# Patient Record
Sex: Female | Born: 1975
Health system: Southern US, Community
[De-identification: ages and names within clinical notes are randomized; demographics above are authoritative.]

## PROBLEM LIST (undated history)

## (undated) DIAGNOSIS — F32A Depression, unspecified: Secondary | ICD-10-CM

## (undated) DIAGNOSIS — I10 Essential (primary) hypertension: Secondary | ICD-10-CM

## (undated) DIAGNOSIS — E119 Type 2 diabetes mellitus without complications: Secondary | ICD-10-CM

## (undated) HISTORY — PX: UTERINE FIBROID SURGERY: SHX826

## (undated) HISTORY — PX: BUNIONECTOMY: SHX129

---

## 1998-11-17 ENCOUNTER — Ambulatory Visit (HOSPITAL_COMMUNITY): Admission: RE | Admit: 1998-11-17 | Discharge: 1998-11-17 | Payer: Self-pay | Admitting: Family Medicine

## 1999-04-08 ENCOUNTER — Encounter (INDEPENDENT_AMBULATORY_CARE_PROVIDER_SITE_OTHER): Payer: Self-pay | Admitting: *Deleted

## 1999-04-08 ENCOUNTER — Ambulatory Visit (HOSPITAL_COMMUNITY): Admission: AD | Admit: 1999-04-08 | Discharge: 1999-04-08 | Payer: Self-pay | Admitting: Obstetrics and Gynecology

## 1999-09-24 ENCOUNTER — Encounter: Payer: Self-pay | Admitting: *Deleted

## 1999-09-24 ENCOUNTER — Inpatient Hospital Stay (HOSPITAL_COMMUNITY): Admission: AD | Admit: 1999-09-24 | Discharge: 1999-09-24 | Payer: Self-pay

## 2000-03-10 ENCOUNTER — Inpatient Hospital Stay (HOSPITAL_COMMUNITY): Admission: AD | Admit: 2000-03-10 | Discharge: 2000-03-10 | Payer: Self-pay | Admitting: Obstetrics and Gynecology

## 2000-03-25 ENCOUNTER — Inpatient Hospital Stay (HOSPITAL_COMMUNITY): Admission: AD | Admit: 2000-03-25 | Discharge: 2000-03-25 | Payer: Self-pay | Admitting: *Deleted

## 2000-04-04 ENCOUNTER — Inpatient Hospital Stay (HOSPITAL_COMMUNITY): Admission: AD | Admit: 2000-04-04 | Discharge: 2000-04-04 | Payer: Self-pay | Admitting: Obstetrics and Gynecology

## 2000-04-06 ENCOUNTER — Inpatient Hospital Stay (HOSPITAL_COMMUNITY): Admission: AD | Admit: 2000-04-06 | Discharge: 2000-04-08 | Payer: Self-pay | Admitting: Obstetrics and Gynecology

## 2000-04-09 ENCOUNTER — Encounter: Admission: RE | Admit: 2000-04-09 | Discharge: 2000-07-08 | Payer: Self-pay | Admitting: Obstetrics and Gynecology

## 2000-07-10 ENCOUNTER — Encounter: Admission: RE | Admit: 2000-07-10 | Discharge: 2000-10-08 | Payer: Self-pay | Admitting: Obstetrics and Gynecology

## 2000-10-10 ENCOUNTER — Encounter: Admission: RE | Admit: 2000-10-10 | Discharge: 2001-01-08 | Payer: Self-pay | Admitting: Obstetrics and Gynecology

## 2001-02-07 ENCOUNTER — Encounter: Admission: RE | Admit: 2001-02-07 | Discharge: 2001-03-09 | Payer: Self-pay | Admitting: Obstetrics and Gynecology

## 2001-03-10 ENCOUNTER — Encounter: Admission: RE | Admit: 2001-03-10 | Discharge: 2001-04-09 | Payer: Self-pay | Admitting: Obstetrics and Gynecology

## 2001-05-10 ENCOUNTER — Encounter: Admission: RE | Admit: 2001-05-10 | Discharge: 2001-06-09 | Payer: Self-pay | Admitting: Obstetrics and Gynecology

## 2001-06-10 ENCOUNTER — Encounter: Admission: RE | Admit: 2001-06-10 | Discharge: 2001-07-10 | Payer: Self-pay | Admitting: Obstetrics and Gynecology

## 2001-09-02 ENCOUNTER — Emergency Department (HOSPITAL_COMMUNITY): Admission: EM | Admit: 2001-09-02 | Discharge: 2001-09-02 | Payer: Self-pay | Admitting: Emergency Medicine

## 2001-09-02 ENCOUNTER — Encounter: Payer: Self-pay | Admitting: Emergency Medicine

## 2004-07-31 ENCOUNTER — Emergency Department (HOSPITAL_COMMUNITY): Admission: EM | Admit: 2004-07-31 | Discharge: 2004-07-31 | Payer: Self-pay | Admitting: Family Medicine

## 2005-12-30 ENCOUNTER — Emergency Department (HOSPITAL_COMMUNITY): Admission: EM | Admit: 2005-12-30 | Discharge: 2005-12-30 | Payer: Self-pay | Admitting: Emergency Medicine

## 2007-07-01 ENCOUNTER — Encounter (INDEPENDENT_AMBULATORY_CARE_PROVIDER_SITE_OTHER): Payer: Self-pay | Admitting: Obstetrics

## 2007-07-01 ENCOUNTER — Ambulatory Visit (HOSPITAL_COMMUNITY): Admission: RE | Admit: 2007-07-01 | Discharge: 2007-07-01 | Payer: Self-pay | Admitting: Obstetrics

## 2007-07-25 ENCOUNTER — Inpatient Hospital Stay (HOSPITAL_COMMUNITY): Admission: RE | Admit: 2007-07-25 | Discharge: 2007-07-25 | Payer: Self-pay | Admitting: Obstetrics and Gynecology

## 2007-07-25 ENCOUNTER — Encounter (INDEPENDENT_AMBULATORY_CARE_PROVIDER_SITE_OTHER): Payer: Self-pay | Admitting: Obstetrics and Gynecology

## 2008-08-11 ENCOUNTER — Encounter: Admission: RE | Admit: 2008-08-11 | Discharge: 2008-08-11 | Payer: Self-pay | Admitting: Family Medicine

## 2008-12-25 ENCOUNTER — Ambulatory Visit: Payer: Self-pay | Admitting: Obstetrics and Gynecology

## 2008-12-25 ENCOUNTER — Inpatient Hospital Stay (HOSPITAL_COMMUNITY): Admission: AD | Admit: 2008-12-25 | Discharge: 2008-12-25 | Payer: Self-pay | Admitting: Obstetrics

## 2009-06-07 ENCOUNTER — Inpatient Hospital Stay (HOSPITAL_COMMUNITY): Admission: RE | Admit: 2009-06-07 | Discharge: 2009-06-09 | Payer: Self-pay | Admitting: Obstetrics

## 2009-06-07 ENCOUNTER — Encounter (INDEPENDENT_AMBULATORY_CARE_PROVIDER_SITE_OTHER): Payer: Self-pay | Admitting: Obstetrics

## 2010-01-14 ENCOUNTER — Emergency Department (HOSPITAL_COMMUNITY): Admission: EM | Admit: 2010-01-14 | Discharge: 2010-01-14 | Payer: Self-pay | Admitting: Emergency Medicine

## 2010-10-31 ENCOUNTER — Emergency Department (HOSPITAL_COMMUNITY)
Admission: EM | Admit: 2010-10-31 | Discharge: 2010-10-31 | Payer: Self-pay | Source: Home / Self Care | Admitting: Family Medicine

## 2010-11-01 LAB — POCT URINALYSIS DIPSTICK
Bilirubin Urine: NEGATIVE
Nitrite: NEGATIVE
Specific Gravity, Urine: 1.02 (ref 1.005–1.030)
Urine Glucose, Fasting: NEGATIVE mg/dL
Urobilinogen, UA: 1 mg/dL (ref 0.0–1.0)

## 2010-11-01 LAB — POCT PREGNANCY, URINE: Preg Test, Ur: NEGATIVE

## 2010-12-27 LAB — COMPREHENSIVE METABOLIC PANEL
ALT: 17 U/L (ref 0–35)
AST: 22 U/L (ref 0–37)
Albumin: 4.1 g/dL (ref 3.5–5.2)
Chloride: 107 mEq/L (ref 96–112)
Creatinine, Ser: 0.65 mg/dL (ref 0.4–1.2)
GFR calc Af Amer: >60 mL/min (ref >60)
Potassium: 4.2 mEq/L (ref 3.5–5.1)
Sodium: 139 mEq/L (ref 135–145)
Total Bilirubin: 0.8 mg/dL (ref 0.3–1.2)

## 2010-12-27 LAB — GC/CHLAMYDIA PROBE AMP, GENITAL
Chlamydia, DNA Probe: NEGATIVE
GC Probe Amp, Genital: NEGATIVE

## 2010-12-27 LAB — DIFFERENTIAL
Basophils Absolute: 0.1 10*3/uL (ref 0.0–0.1)
Eosinophils Absolute: 0.2 10*3/uL (ref 0.0–0.7)
Eosinophils Relative: 4 % (ref 0–5)
Lymphocytes Relative: 35 % (ref 12–46)
Monocytes Absolute: 0.4 10*3/uL (ref 0.1–1.0)

## 2010-12-27 LAB — URINALYSIS, ROUTINE W REFLEX MICROSCOPIC
Ketones, ur: NEGATIVE mg/dL
Nitrite: NEGATIVE
Protein, ur: NEGATIVE mg/dL

## 2010-12-27 LAB — CBC
Platelets: 222 10*3/uL (ref 150–400)
RBC: 4.82 MIL/uL (ref 3.87–5.11)
WBC: 5.9 10*3/uL (ref 4.0–10.5)

## 2010-12-27 LAB — WET PREP, GENITAL

## 2010-12-27 LAB — URINE MICROSCOPIC-ADD ON

## 2011-01-12 LAB — CBC
HCT: 28.9 % — ABNORMAL LOW (ref 36.0–46.0)
Hemoglobin: 9.4 g/dL — ABNORMAL LOW (ref 12.0–15.0)
MCHC: 32.4 g/dL (ref 30.0–36.0)
RDW: 14.6 % (ref 11.5–15.5)

## 2011-01-13 LAB — BASIC METABOLIC PANEL
CO2: 28 mEq/L (ref 19–32)
Calcium: 9 mg/dL (ref 8.4–10.5)
Creatinine, Ser: 0.56 mg/dL (ref 0.4–1.2)
GFR calc Af Amer: >60 mL/min (ref >60)
Glucose, Bld: 97 mg/dL (ref 70–99)

## 2011-01-13 LAB — TYPE AND SCREEN
ABO/RH(D): O POS
Antibody Screen: NEGATIVE

## 2011-01-13 LAB — CBC
MCHC: 31.9 g/dL (ref 30.0–36.0)
RDW: 14 % (ref 11.5–15.5)

## 2011-01-18 LAB — CBC
HCT: 36 % (ref 36.0–46.0)
MCV: 79.9 fL (ref 78.0–100.0)
Platelets: 219 10*3/uL (ref 150–400)
RDW: 13.5 % (ref 11.5–15.5)

## 2011-02-20 NOTE — Op Note (Signed)
[name], [name] NO.:  [id number]   MEDICAL RECORD NO.:  [id number]          PATIENT TYPE:  AMB   LOCATION:  SDC                           FACILITY:  [facility]   PHYSICIAN:  Lendon Colonel, MD   DATE OF BIRTH:  [birth year]   DATE OF PROCEDURE:  07/01/2007  DATE OF DISCHARGE:                               OPERATIVE REPORT   PREOPERATIVE DIAGNOSIS:  Undesired pregnancy at 11 plus weeks.   POSTOPERATIVE DIAGNOSES:  1. Undesired pregnancy at 11 plus weeks.  2. Large lower segment fibroid.   PROCEDURE:  Attempted dilatation and evacuation under ultrasound  guidance.   SURGEON:  Lendon Colonel, MD.   ASSISTANT:  None.   ANESTHESIA:  Local with sedation.   FINDINGS:  A 10 x 9 x 9 cm lower posterior segment intramural fibroid.  Active fetal heartbeat post-procedure.   SPECIMEN:  EMC sent to Pathology.   ESTIMATED BLOOD LOSS:  50 mL.   ANTIBIOTICS:  100 mg IV doxycycline.   COMPLICATIONS:  Failed procedure.   PROCEDURE IN DETAIL:  After meeting with the patient in the pre-op area  and reviewing risks of procedure (including increased risks in her case  given the large fibroid), informed consent was obtained. The patient was  taken to the operating room where sedation was initiated.  A bimanual  examination was performed which revealed a uterus twice the size  expected.  A sterile speculum was inserted into the vagina, 1% lidocaine  6 mL each at 4 and 7 o'clock in the cervical paracervical junction was  administered for local anesthesia.  A tenaculum was placed at 12 o'clock  on the cervix.  The cervix was serially dilated to a number 35 Pratt  dilator without complication.  A 12-French suction curet was then  inserted into the endometrial canal and on three passes removed a scant  amount of tissue.  A very gentle sharp curettage was performed and a  gritty texture, consistent with complete evacuation, was noted.  Due to  the scant amount of tissue  obtained, and the the smaller than expected  uterine sound length, ultrasound was called for.  Upon ultrasonographic  evaluation, a very large posterior lower segment fibroid was noted and  the embryo was seen with an active FH, anterior and superior to the  fibroid.  Multiple attempts were made to access the gestational sac.  Small pratt dilators and the malleable sound were used.  However, given  the position of the fibroid, the gestational sac was unable to be  successfully accessed.  Approximately 15 minutes were spent under  ultrasound guidance attempting to access the gestational sac.  The  tenaculum was changed to the posterior lip of the cervix and the  speculum was removed to allow for further anteversion of the  instruments,  however, access to the unwanted pregnancy was not  accomplished.  It was then decided to terminate the procedure.  All  instruments were removed.  The tenaculum was removed.  Excellent  hemostasis was noted from the tenaculum site.  The patient was awakened  from her sedation,  having tolerated the procedure well.  The sponge,  lap, and needle counts were correct times three.  The patient was  recovering in the PACU with stable vital signs.  A long discussion was  had with the patient and her family regarding the results of the  procedure and the decision to stop the procedure. Patient was offerred  an evaluation at [facility] with possibility of repeat attempt at  [facility].  The patient was given Motrin p.r.n., Flagyl 500 mg p.o.  twice daily for antibiotic prophylaxis given the cervical canal was  opened and was given the personal phone number of this physician for  contact at any time should she have any questions. Pt was asked to call  imediately should she notice increased bleeding, sharp abdominal pain or  fever of any sort.      Lendon Colonel, MD  Electronically Signed     KAF/MEDQ  D:  07/01/2007  T:  07/01/2007  Job:  [id number]

## 2011-02-20 NOTE — Op Note (Signed)
NAMEAQUEELAH, COTRELL NO.:  [id number]   MEDICAL RECORD NO.:  [id number]          PATIENT TYPE:  INP   LOCATION:  9305                          FACILITY:  [facility]   PHYSICIAN:  Lendon Colonel, MD   DATE OF BIRTH:  [birth year]   DATE OF PROCEDURE:  06/07/2009  DATE OF DISCHARGE:                               OPERATIVE REPORT   PREOPERATIVE DIAGNOSES:  Menorrhagia, fibroid uterus.   POSTOPERATIVE DIAGNOSES:  Menorrhagia, fibroid uterus.   PROCEDURES:  Exploratory laparotomy, myomectomy.   SURGEON:  Lendon Colonel, MD.   ASSISTANT:  Dr. Juliene Pina.   ANESTHESIA:  General.   FINDINGS:  Normal tubes and ovaries, 5 x 5 cm posterior uterine fibroid.   SPECIMENS:  Fibroid.   DISPOSITIONS:  Pathology.   ANTIBIOTICS:  Ancef 2 g.   ESTIMATED BLOOD LOSS:  400 mL.   COMPLICATIONS:  None.   INDICATIONS:  This is a 35 year old G6, P2 with history of menorrhagia,  large fibroid uterus, fatigue, anemia, and mass symptoms, who presents  for operative management of fibroids after 3 months of Depo-Lupron for  improvement in anemia and shrinkage of fibroids.  The patient understood  her options of robotic myomectomy, uterine artery embolization, and  hormonal control.   PROCEDURE IN DETAILS:  After informed consent was obtained, the patient  was taken to the operating room where general anesthesia was initiated  without difficulty.  She was prepped and draped in the normal sterile  fashion in the dorsal supine position.  A Foley catheter was inserted  sterilely into the bladder.  A bimanual examination was done to assess  the size and the position of the uterus.  A Pfannenstiel skin incision  was made 2 cm above the pubic symphysis in the midline with the scalpel  and was carried through to the underlying layer of fascia with Bovie  cautery.  Capillary perforators were cauterized upon entry.  The fascia  was incised in the midline and the incision was extended laterally.   The  inferior aspect of the fascial incision was grasped with Kocher clamps,  elevated up and the underlying rectus muscles were dissected off bluntly  and with the Bovie cautery. The superior aspect of the fascial incision  was grasped with Kocher clamps, elevated up and the underlying rectus  muscles were dissected off bluntly and with the Bovie cautery, the  rectus muscles were separated in the midline.  The peritoneum was  identified, bowel could be seen peristalsing below the peritoneum,  peritoneum was picked up with pickups x2 and entered sharply.  Peritoneal incision was extended superiorly and inferiorly with good  visualization of the bladder.  A Balfour self-retaining retractor was  placed into the incision and opened, the bladder blade was inserted.  Green towels were used to raise the Balfour retractor off the patient's  skin and soft tissue.  Two moist laparotomy sponges were used to pack  away the bowel and the patient was placed in Trendelenburg.  The tubes  and ovaries were evaluated, found to be normal.  The uterus was  evaluated, 5 cm posterior  fibroid was noted.  A solution of 20 units of  vasopressin in 30 mL of normal saline was used, about 10 mL was used to  inject. A 4-cm incision on the posterior wall of the uterus was made  using the needle-point Bovie, incision was made through the serosa and  the myometrium until the fibroid was encountered.  Given the prior Depo-  Lupron, the plane between the fibroid and the myometrium was not clear  initially and bleeding was noted.  This was controlled with additional  Bovie cautery.  Using Allis clamps on the uterus, thyroid clamps on the  fibroid, the fibroid was serially dissected out with Bovie cautery.  The  base of the fibroid was cauterized out, removed, and fibroid was  measured and sent to Pathology.  Several figure-of-eight sutures were  used in the base of the defect to control hemostasis.  Of note, the   endometrial cavity was not encountered.  About 6 figure-of-eight sutures  were used to control hemostasis.  A deep figure-of-eight suture was then  placed to further close the defect.  A vertical closure was then done.  The myometrium was closed in 2 additional layers and the serosa was  closed with a 2-0 Vicryl in a baseball stitch fashion.  Excellent  hemostasis was noted.  A piece of Interceed was placed over the uterine  incision after confirmation of hemostasis and irrigation.  The packing  was removed.  The peritoneum was closed with 2-0 Vicryl.  The cut muscle  edges on either side of the fascia were inspected, found to be  hemostatic.  Fascia was closed with 0 Vicryl in a running fashion in two  halves.  The subcutaneous tissue was closed with a 2-0 plain gut and the  skin was closed with a 4-0 Monocryl in a subcuticular fashion.  The  patient tolerated the procedure well.  Sponge, lap, and needle counts  were correct x3, and the patient was taken to the recovery room in  stable condition.       Lendon Colonel, MD  Electronically Signed     KAF/MEDQ  D:  06/07/2009  T:  06/07/2009  Job:  [id number]

## 2011-02-23 NOTE — H&P (Signed)
[facility]  Patient:    [name], ELSAYED                     MRN: [id number] Adm. Date:  [id number] Attending:  Leonard Schwartz Dictator:   Miguel Dibble, C.N.M.                         History and Physical  DATE OF BIRTH:                05-Feb-1976.  HISTORY OF PRESENT ILLNESS:   This is a 35 year old gravida 4, para 0, 1, 2, 1 at 36 weeks with contractions since approximately 1 a.m. that have been strong and regular.  Her cervix was 1 cm at her last vaginal exam in the office.  Her cervix has changed from 3, 80%, -1 to 4, 90% and -1 after two hours of ambulation in maternity admissions unit.  She has a negative group beta strep history.  She has had a previous labor of four hours at 36 weeks after an attempt to manage preterm labor for approximately 24 hours.  PRENATAL LABORATORY DATA:     Her prenatal laboratories are as follows. Hemoglobin 12.2, hematocrit 37.6 and platelets 231,000.  Blood type and Rh:  O positive, Rh antibodies negative.  VDRL nonreactive.  Rubella titer immune. Hepatitis B surface antigen negative.  Glucose challenge test at 18 weeks was 93.  Pap smear; benign reactive changes.  Gonorrhea and Chlamydia cultures negative.  Glucose challenge test at 28 weeks was within normal limits.  Group beta strep at 35 weeks is negative.   Patient was treated for UTI during the pregnancy.  Normal ultrasound revealing normal anatomy, confirms dates.  MEDICAL HISTORY:              No known drug allergies.  Environmental allergies.  Abnormal Pap smear in 1999; repeat Pap smears since then have been normal.  Treated for Chlamydia in the past and Trichomonas in the past. Wisdom tooth extraction in April of 1999.  D&E in 05-11-99.  FAMILY HISTORY:               Paternal grandmother with hypertension. Maternal grandmother with varicosities.  Paternal grandmother with insulin-dependent diabetes mellitus.  Paternal aunt with  breast cancer postmenopausal.  Mother with bipolar disorder.  GENETIC HISTORY:              Negative.  SOCIAL HISTORY:               Father of the baby is involved.  Patients mother is also present and supportive.  Patient is a Engineer, maintenance (IT).  Works for [employer] full-time.  Father of the baby is a high Garment/textile technologist and works full-time.  Denies smoking, alcohol or drug abuse.  OBSTETRICAL HISTORY:          October of 1993, termination of pregnancy at eight weeks without complications.  August of 1994, spontaneous vaginal delivery of a viable female at 36 weeks weighing 5 pounds 15 ounces after four hours of active labor.  This pregnancy was complicated by non- insulin-dependent gestational diabetes.   May 11, 1999, intrauterine fetal death at 13 weeks followed by a D&E.  PHYSICAL EXAMINATION:  HEENT:                        Head, eyes, ears, nose, and throat are within  normal limits.  LUNGS:                        Bilaterally clear.  HEART:                        Regular rate and rhythm.  ABDOMEN:                      Soft and nontender.  Contractions every three to four minutes.  Fetal heart rate is reactive and reassuring.  PELVIC EXAMINATION:           Cervix is 4 cm, 90% effaced, vertex and -1. Intact bag of waters.  EXTREMITIES:                  Negative edema.  NEUROLOGIC:                   DTRs +1.  ASSESSMENT:                   1. Multiparous at 36 weeks.                               2. Previous history of preterm labor and                                  delivery at 36 weeks.                               3. Negative group Beta Streptococcus.  PLAN:                         1. Admit to labor and delivery.                               2. Notify Dr. Stefano Gaul of admission and status.                               3. Plans for C.N.M. management per patients                                  request.                               4. Routine [facility]  orders.                               5. Anticipate normal spontaneous vaginal                                  delivery. DD:  04/06/00 TD:  04/06/00 Job: [id number]

## 2011-04-09 ENCOUNTER — Inpatient Hospital Stay (INDEPENDENT_AMBULATORY_CARE_PROVIDER_SITE_OTHER)
Admission: RE | Admit: 2011-04-09 | Discharge: 2011-04-09 | Disposition: A | Discharge status: Home / Self Care | Payer: Self-pay | Source: Ambulatory Visit | Attending: Family Medicine | Admitting: Family Medicine

## 2011-04-09 DIAGNOSIS — J069 Acute upper respiratory infection, unspecified: Secondary | ICD-10-CM

## 2011-07-18 LAB — CBC
Platelets: 313
RDW: 14.4 — ABNORMAL HIGH

## 2011-07-19 LAB — CBC
Hemoglobin: 11.6 — ABNORMAL LOW
RBC: 4.6
RDW: 15.1 — ABNORMAL HIGH

## 2011-08-18 ENCOUNTER — Emergency Department (HOSPITAL_COMMUNITY)
Admission: EM | Admit: 2011-08-18 | Discharge: 2011-08-18 | Disposition: A | Discharge status: Home / Self Care | Payer: Self-pay | Source: Home / Self Care

## 2011-08-18 ENCOUNTER — Encounter: Payer: Self-pay | Admitting: *Deleted

## 2011-08-18 DIAGNOSIS — J029 Acute pharyngitis, unspecified: Secondary | ICD-10-CM

## 2011-08-18 DIAGNOSIS — M542 Cervicalgia: Secondary | ICD-10-CM

## 2011-08-18 MED ORDER — IBUPROFEN 600 MG PO TABS: 600.0000 mg | ORAL_TABLET | Freq: Four times a day (QID) | ORAL | Status: AC | PRN

## 2011-08-18 MED ORDER — MAGIC MOUTHWASH W/LIDOCAINE: 5.0000 mL | Freq: Three times a day (TID) | ORAL | Status: DC | PRN

## 2011-08-18 MED ORDER — FLUTICASONE PROPIONATE 50 MCG/ACT NA SUSP: 2.0000 | Freq: Every day | NASAL | Status: AC

## 2011-08-18 NOTE — ED Provider Notes (Signed)
History     CSN: [id number] Arrival date & time: 08/18/2011  4:47 PM   First MD Initiated Contact with Patient 08/18/11 1622      Chief Complaint  Patient presents with  . Sore Throat    Headache; Tender Glands    (Consider location/radiation/quality/duration/timing/severity/associated sxs/prior treatment) Patient is a 35 y.o. female presenting with pharyngitis. The history is provided by the patient.  Sore Throat This is a new problem. The current episode started more than 2 days ago. The problem occurs constantly. The problem has been gradually worsening. Pertinent negatives include no abdominal pain. The symptoms are aggravated by swallowing. The symptoms are relieved by NSAIDs.   Pt with 1 week of ST, bilateral anterior neck pain,. States she feels swollen glands. No nasal congestion, sinus pain, pressure, fevers, voice changes, intraoral blisters, HA, ear pain, change in hearing.   History reviewed. No pertinent past medical history.  Past Surgical History  Procedure Date  . Uterine fibroid surgery     No family history on file.  History  Substance Use Topics  . Smoking status: Not on file  . Smokeless tobacco: Not on file  . Alcohol Use:     OB History    Grav Para Term Preterm Abortions TAB SAB Ect Mult Living                  Review of Systems  Constitutional: Negative for fever.  HENT: Positive for sore throat. Negative for ear pain, rhinorrhea, sneezing, mouth sores, trouble swallowing, dental problem, voice change and sinus pressure.   Respiratory: Negative for cough, chest tightness and wheezing.   Gastrointestinal: Negative for nausea, vomiting and abdominal pain.  Musculoskeletal: Negative for myalgias.  Skin: Negative for rash.    Allergies  Review of patient's allergies indicates not on file.  Home Medications   Current Outpatient Rx  Name Route Sig Dispense Refill  . MAGIC MOUTHWASH W/LIDOCAINE Oral Take 5 mLs by mouth 3 (three) times daily  as needed. 120 mL 0  . FLUTICASONE PROPIONATE 50 MCG/ACT NA SUSP Nasal Place 2 sprays into the nose daily. 16 g 2  . IBUPROFEN 600 MG PO TABS Oral Take 1 tablet (600 mg total) by mouth every 6 (six) hours as needed for pain or fever. 20 tablet 0    BP 117/69  Pulse 81  Temp(Src) 98.2 F (36.8 C) (Oral)  Resp 20  SpO2 100%  LMP 07/25/2011  Physical Exam  Nursing note and vitals reviewed. Constitutional: She is oriented to person, place, and time. She appears well-developed and well-nourished. No distress.  HENT:  Head: Normocephalic and atraumatic.  Nose: Rhinorrhea present.  Mouth/Throat: Uvula is midline and mucous membranes are normal. Posterior oropharyngeal erythema present. No oropharyngeal exudate or posterior oropharyngeal edema.       Cobblestoned post pharynx, postnasal drip  Eyes: EOM are normal. Pupils are equal, round, and reactive to light.  Neck: Normal range of motion.       Bilateral shotty tender LN  Cardiovascular: Regular rhythm.   Pulmonary/Chest: Effort normal and breath sounds normal.  Abdominal: She exhibits no distension.  Musculoskeletal: Normal range of motion.  Neurological: She is alert and oriented to person, place, and time.  Skin: Skin is warm and dry.  Psychiatric: She has a normal mood and affect. Her behavior is normal. Judgment and thought content normal.    ED Course  Procedures (including critical care time) Results for orders placed during the hospital encounter of 08/18/11  POCT RAPID STREP A ([facility] CARE ONLY)      Component Value Range   Streptococcus, Group A Screen (Direct) NEGATIVE  NEGATIVE      Labs Reviewed  POCT RAPID STREP A ([facility] ONLY)   No results found.   1. Pharyngitis   2. Neck pain       MDM          Danella Maiers [facility] 08/18/11 1857

## 2011-08-18 NOTE — ED Notes (Signed)
C/O sore glands over past week w/ sore throat; started w/ HA last night.  Also c/o soreness up into bilat ears.  Denies fevers.  Has been taking IBU.

## 2011-08-18 NOTE — ED Provider Notes (Signed)
History     CSN: [id number] Arrival date & time: 08/18/2011  4:47 PM   First MD Initiated Contact with Patient 08/18/11 1622      Chief Complaint  Patient presents with  . Sore Throat    Headache; Tender Glands    (Consider location/radiation/quality/duration/timing/severity/associated sxs/prior treatment) HPI  History reviewed. No pertinent past medical history.  Past Surgical History  Procedure Date  . Uterine fibroid surgery     No family history on file.  History  Substance Use Topics  . Smoking status: Not on file  . Smokeless tobacco: Not on file  . Alcohol Use:     OB History    Grav Para Term Preterm Abortions TAB SAB Ect Mult Living                  Review of Systems  Allergies  Review of patient's allergies indicates not on file.  Home Medications  No current outpatient prescriptions on file.  BP 117/69  Pulse 81  Temp(Src) 98.2 F (36.8 C) (Oral)  Resp 20  SpO2 100%  LMP 07/25/2011  Physical Exam  ED Course  Procedures (including critical care time)   Labs Reviewed  POCT RAPID STREP A ([facility] ONLY)   No results found.   No diagnosis found.    MDM  No cold like symptoms-         Jimmie Molly, MD 08/18/11 [phone]

## 2012-08-15 ENCOUNTER — Other Ambulatory Visit: Payer: Self-pay | Admitting: Family Medicine

## 2012-08-15 DIAGNOSIS — Z1231 Encounter for screening mammogram for malignant neoplasm of breast: Secondary | ICD-10-CM

## 2012-09-29 ENCOUNTER — Ambulatory Visit: Payer: Self-pay

## 2012-11-11 ENCOUNTER — Ambulatory Visit
Admission: RE | Admit: 2012-11-11 | Discharge: 2012-11-11 | Disposition: A | Discharge status: Home / Self Care | Payer: BC Managed Care – PPO | Source: Ambulatory Visit | Attending: Family Medicine | Admitting: Family Medicine

## 2012-11-11 DIAGNOSIS — Z1231 Encounter for screening mammogram for malignant neoplasm of breast: Secondary | ICD-10-CM

## 2013-11-11 ENCOUNTER — Other Ambulatory Visit: Payer: Self-pay

## 2013-11-11 DIAGNOSIS — Z1231 Encounter for screening mammogram for malignant neoplasm of breast: Secondary | ICD-10-CM

## 2013-12-03 ENCOUNTER — Ambulatory Visit: Payer: BC Managed Care – PPO

## 2013-12-28 ENCOUNTER — Ambulatory Visit
Admission: RE | Admit: 2013-12-28 | Discharge: 2013-12-28 | Disposition: A | Discharge status: Home / Self Care | Payer: Self-pay | Source: Ambulatory Visit

## 2013-12-28 DIAGNOSIS — Z1231 Encounter for screening mammogram for malignant neoplasm of breast: Secondary | ICD-10-CM

## 2014-11-30 ENCOUNTER — Emergency Department (HOSPITAL_BASED_OUTPATIENT_CLINIC_OR_DEPARTMENT_OTHER)
Admission: EM | Admit: 2014-11-30 | Discharge: 2014-12-01 | Disposition: A | Discharge status: Home / Self Care | Payer: Worker's Compensation | Attending: Emergency Medicine | Admitting: Emergency Medicine

## 2014-11-30 ENCOUNTER — Encounter (HOSPITAL_BASED_OUTPATIENT_CLINIC_OR_DEPARTMENT_OTHER): Payer: Self-pay | Admitting: *Deleted

## 2014-11-30 DIAGNOSIS — S0083XA Contusion of other part of head, initial encounter: Secondary | ICD-10-CM

## 2014-11-30 DIAGNOSIS — Y9289 Other specified places as the place of occurrence of the external cause: Secondary | ICD-10-CM | POA: Insufficient documentation

## 2014-11-30 DIAGNOSIS — Y990 Civilian activity done for income or pay: Secondary | ICD-10-CM | POA: Insufficient documentation

## 2014-11-30 DIAGNOSIS — W208XXA Other cause of strike by thrown, projected or falling object, initial encounter: Secondary | ICD-10-CM | POA: Diagnosis not present

## 2014-11-30 DIAGNOSIS — Y9389 Activity, other specified: Secondary | ICD-10-CM | POA: Insufficient documentation

## 2014-11-30 DIAGNOSIS — S0990XA Unspecified injury of head, initial encounter: Secondary | ICD-10-CM | POA: Diagnosis present

## 2014-11-30 MED ORDER — HYDROCODONE-ACETAMINOPHEN 5-325 MG PO TABS: 1.0000 | ORAL_TABLET | ORAL | Status: DC | PRN

## 2014-11-30 NOTE — ED Notes (Signed)
Pt c/o hit to head by wooded shelves x 2 hrs ago NO LOC

## 2014-11-30 NOTE — Discharge Instructions (Signed)
Continue ice for 20 minutes, 3-4 times per day. Motrin/Ibuprofen/Advil as needed.  Facial or Scalp Contusion  A facial or scalp contusion is a deep bruise on the face or head. Contusions happen when an injury causes bleeding under the skin. Signs of bruising include pain, puffiness (swelling), and discolored skin. The contusion may turn blue, purple, or yellow. HOME CARE  Only take medicines as told by your doctor.  Put ice on the injured area.  Put ice in a plastic bag.  Place a towel between your skin and the bag.  Leave the ice on for 20 minutes, 2-3 times a day. GET HELP IF:  You have bite problems.  You have pain when chewing.  You are worried about your face not healing normally. GET HELP RIGHT AWAY IF:   You have severe pain or a headache and medicine does not help.  You are very tired or confused, or your personality changes.  You throw up (vomit).  You have a nosebleed that will not stop.  You see two of everything (double vision) or have blurry vision.  You have fluid coming from your nose or ear.  You have problems walking or using your arms or legs. MAKE SURE YOU:   Understand these instructions.  Will watch your condition.  Will get help right away if you are not doing well or get worse. Document Released: 09/13/2011 Document Revised: 07/15/2013 Document Reviewed: 05/07/2013 [facility] LLCExitCare Patient Information 2015 [place], [place]. This information is not intended to replace advice given to you by your health care provider. Make sure you discuss any questions you have with your health care provider.

## 2014-11-30 NOTE — ED Provider Notes (Signed)
CSN: [id number]     Arrival date & time 11/30/14  1656 History   First MD Initiated Contact with Patient 11/30/14 1707     Chief Complaint  Patient presents with  . Head Injury     HPI  Patient presents for evaluation of an injury to the right cheek. She was at work. She was leaning over. States there is a shelf that is about 2 feet high. History shows on it. A coworker bumped it. It fell over check or gets her right cheek. She complains of pain in this area. She has eaten since. No pain with chewing. No bleeding from ears nose or mouth. No vision changes. No blurring or doubling.  History reviewed. No pertinent past medical history. Past Surgical History  Procedure Laterality Date  . Uterine fibroid surgery     History reviewed. No pertinent family history. History  Substance Use Topics  . Smoking status: Never Smoker   . Smokeless tobacco: Not on file  . Alcohol Use: No   OB History    No data available     Review of Systems  Constitutional: Negative for fever, chills, diaphoresis, appetite change and fatigue.  HENT: Negative for mouth sores, sore throat and trouble swallowing.        Pain over the right cheek.  Eyes: Negative for visual disturbance.  Respiratory: Negative for cough, chest tightness, shortness of breath and wheezing.   Cardiovascular: Negative for chest pain.  Gastrointestinal: Negative for nausea, vomiting, abdominal pain, diarrhea and abdominal distention.  Endocrine: Negative for polydipsia, polyphagia and polyuria.  Genitourinary: Negative for dysuria, frequency and hematuria.  Musculoskeletal: Negative for gait problem.  Skin: Negative for color change, pallor and rash.  Neurological: Negative for dizziness, syncope, light-headedness and headaches.  Hematological: Does not bruise/bleed easily.  Psychiatric/Behavioral: Negative for behavioral problems and confusion.      Allergies  Review of patient's allergies indicates no known  allergies.  Home Medications   Prior to Admission medications   Medication Sig Start Date End Date Taking? Authorizing Provider  Alum & Mag Hydroxide-Simeth (MAGIC MOUTHWASH W/LIDOCAINE) SOLN Take 5 mLs by mouth 3 (three) times daily as needed. 08/18/11   Domenick Gong, MD  fluticasone [facility]) 50 MCG/ACT nasal spray Place 2 sprays into the nose daily. 08/18/11 08/17/12  Domenick Gong, MD  HYDROcodone-acetaminophen (NORCO/VICODIN) 5-325 MG per tablet Take 1 tablet by mouth every 4 (four) hours as needed. 11/30/14   Rolland Porter, MD   BP 140/81 mmHg  Pulse 74  Temp(Src) 98.1 F (36.7 C) (Oral)  Resp 16  Ht  (1.549 m)  Wt 154 lb (69.854 kg)  BMI 29.11 kg/m2  SpO2 98% Physical Exam  Constitutional: She is oriented to person, place, and time. She appears well-developed and well-nourished. No distress.  HENT:  Head: Normocephalic.    Tender over the right zygoma. Normal shock or movements. Normal sensation V1 through V3. No dental trauma. She can bite on a vertically placed tongue blade without sinus can pain. No blood over the TM. Normal vision. Normal symmetric reactive pupils.  Eyes: Conjunctivae are normal. Pupils are equal, round, and reactive to light. No scleral icterus.  Neck: Normal range of motion. Neck supple. No thyromegaly present.  Cardiovascular: Normal rate and regular rhythm.  Exam reveals no gallop and no friction rub.   No murmur heard. Pulmonary/Chest: Effort normal and breath sounds normal. No respiratory distress. She has no wheezes. She has no rales.  Abdominal: Soft. Bowel sounds are normal.  She exhibits no distension. There is no tenderness. There is no rebound.  Musculoskeletal: Normal range of motion.  Neurological: She is alert and oriented to person, place, and time.  Skin: Skin is warm and dry. No rash noted.  Psychiatric: She has a normal mood and affect. Her behavior is normal.    ED Course  Procedures (including critical care time) Labs  Review Labs Reviewed - No data to display  Imaging Review No results found.   EKG Interpretation None      MDM   Final diagnoses:  Facial contusion, initial encounter    Patient has tenderness across the right zygoma. She has no decreased sensation to face. Normal shock or movements. No difficult with chewing. A lot of the TMs or mastoids or from ears nose or mouth. No signs of facial trauma. Minimal mechanism with a small shelf falling against her. Encouraged her to simply use ice and Motrin for this. Soft diet as chewing may make her  more uncomfortable.    Rolland PorterMark Shermar Friedland, MD 11/30/14 [phone]

## 2017-06-19 ENCOUNTER — Other Ambulatory Visit: Payer: Self-pay | Admitting: General Surgery

## 2017-06-19 DIAGNOSIS — K429 Umbilical hernia without obstruction or gangrene: Secondary | ICD-10-CM

## 2017-07-04 ENCOUNTER — Ambulatory Visit
Admission: RE | Admit: 2017-07-04 | Discharge: 2017-07-04 | Disposition: A | Discharge status: Home / Self Care | Payer: BC Managed Care – PPO | Source: Ambulatory Visit | Attending: General Surgery | Admitting: General Surgery

## 2017-07-04 DIAGNOSIS — K429 Umbilical hernia without obstruction or gangrene: Secondary | ICD-10-CM

## 2018-10-27 ENCOUNTER — Other Ambulatory Visit: Payer: Self-pay | Admitting: Podiatry

## 2018-10-27 ENCOUNTER — Ambulatory Visit (INDEPENDENT_AMBULATORY_CARE_PROVIDER_SITE_OTHER): Payer: BC Managed Care – PPO

## 2018-10-27 ENCOUNTER — Ambulatory Visit: Payer: BC Managed Care – PPO | Admitting: Podiatry

## 2018-10-27 ENCOUNTER — Encounter: Payer: Self-pay | Admitting: Podiatry

## 2018-10-27 VITALS — BP 108/74 | HR 99

## 2018-10-27 DIAGNOSIS — M21612 Bunion of left foot: Principal | ICD-10-CM

## 2018-10-27 DIAGNOSIS — M21611 Bunion of right foot: Secondary | ICD-10-CM | POA: Diagnosis not present

## 2018-10-27 DIAGNOSIS — M79671 Pain in right foot: Secondary | ICD-10-CM

## 2018-10-27 DIAGNOSIS — M79672 Pain in left foot: Secondary | ICD-10-CM | POA: Diagnosis not present

## 2018-10-27 NOTE — Patient Instructions (Signed)
Pre-Operative Instructions  Congratulations, you have decided to take an important step towards improving your quality of life.  You can be assured that the doctors and staff at [facility] will be with you every step of the way.  Here are some important things you should know:  1. Plan to be at the surgery center/hospital at least 1 (one) hour prior to your scheduled time, unless otherwise directed by the surgical center/hospital staff.  You must have a responsible adult accompany you, remain during the surgery and drive you home.  Make sure you have directions to the surgical center/hospital to ensure you arrive on time. 2. If you are having surgery at [facility] or [facility], you will need a copy of your medical history and physical form from your family physician within one month prior to the date of surgery. We will give you a form for your primary physician to complete.  3. We make every effort to accommodate the date you request for surgery.  However, there are times where surgery dates or times have to be moved.  We will contact you as soon as possible if a change in schedule is required.   4. No aspirin/ibuprofen for one week before surgery.  If you are on aspirin, any non-steroidal anti-inflammatory medications (Mobic, Aleve, Ibuprofen) should not be taken seven (7) days prior to your surgery.  You make take Tylenol for pain prior to surgery.  5. Medications - If you are taking daily heart and blood pressure medications, seizure, reflux, allergy, asthma, anxiety, pain or diabetes medications, make sure you notify the surgery center/hospital before the day of surgery so they can tell you which medications you should take or avoid the day of surgery. 6. No food or drink after midnight the night before surgery unless directed otherwise by surgical center/hospital staff. 7. No alcoholic beverages 24-hours prior to surgery.  No smoking 24-hours prior or 24-hours after  surgery. 8. Wear loose pants or shorts. They should be loose enough to fit over bandages, boots, and casts. 9. Don't wear slip-on shoes. Sneakers are preferred. 10. Bring your boot with you to the surgery center/hospital.  Also bring crutches or a walker if your physician has prescribed it for you.  If you do not have this equipment, it will be provided for you after surgery. 11. If you have not been contacted by the surgery center/hospital by the day before your surgery, call to confirm the date and time of your surgery. 12. Leave-time from work may vary depending on the type of surgery you have.  Appropriate arrangements should be made prior to surgery with your employer. 13. Prescriptions will be provided immediately following surgery by your doctor.  Fill these as soon as possible after surgery and take the medication as directed. Pain medications will not be refilled on weekends and must be approved by the doctor. 14. Remove nail polish on the operative foot and avoid getting pedicures prior to surgery. 15. Wash the night before surgery.  The night before surgery wash the foot and leg well with water and the antibacterial soap provided. Be sure to pay special attention to beneath the toenails and in between the toes.  Wash for at least three (3) minutes. Rinse thoroughly with water and dry well with a towel.  Perform this wash unless told not to do so by your physician.  Enclosed: 1 Ice pack (please put in freezer the night before surgery)   1 Hibiclens skin cleaner     Pre-op instructions  If you have any questions regarding the instructions, please do not hesitate to call our office.  [place]: [address], [place], [place] [address] -- [phone]  [place]: [address] [place], [place] [address] -- [phone]  [place]: [address]  [place], [place] [address] -- [phone]  [place]: [address], [address], [place], [place] [address] -- [phone]  Website:  [email or web address] 

## 2018-11-03 NOTE — Progress Notes (Signed)
   Subjective: 43 year old female presenting today as a new patient, referred by Dr. Elijah Birkom, with a chief complaint of sharp pain secondary to bilateral bunions, left worse than right, that have been present for the past several years. She states the pain is becoming more constant and is progressively worsening. She reports associated burning pain and swelling. She has tried wearing different shoes for treatment with no significant relief. She states she walks excessively for work which increases her symptoms. Patient is here for further evaluation and treatment.   History reviewed. No pertinent past medical history.    Objective: Physical Exam General: The patient is alert and oriented x3 in no acute distress.  Dermatology: Skin is cool, dry and supple bilateral lower extremities. Negative for open lesions or macerations.  Vascular: Palpable pedal pulses bilaterally. No edema or erythema noted. Capillary refill within normal limits.  Neurological: Epicritic and protective threshold grossly intact bilaterally.   Musculoskeletal Exam: Clinical evidence of bunion deformity noted to the respective foot. There is moderate pain on palpation range of motion of the first MPJ. Lateral deviation of the hallux noted consistent with hallux abductovalgus.  Radiographic Exam: Increased intermetatarsal angle greater than 15 with a hallux abductus angle greater than 30 noted on AP view. Moderate degenerative changes noted within the first MPJ.  Assessment: 1. HAV w/ bunion deformity bilateral, left worse than right     Plan of Care:  1. Patient was evaluated. X-Rays reviewed. 2. Today we discussed the conservative versus surgical management of the presenting pathology. The patient opts for surgical management. All possible complications and details of the procedure were explained. All patient questions were answered. No guarantees were expressed or implied. 3. Authorization for surgery was initiated  today. Surgery will consist of bunionectomy with metatarsal osteotomy left.  4. CAM boot dispensed.  5. Return to clinic one week post op.      Felecia ShellingBrent M. , DPM [facility]  Dr. Felecia ShellingBrent M. , DPM    [address]                                        [place], [place] [id number]                Office [phone]  Fax [phone]

## 2018-12-25 ENCOUNTER — Telehealth: Payer: Self-pay | Admitting: *Deleted

## 2018-12-25 NOTE — Telephone Encounter (Signed)
"  I was scheduled to have surgery today.  It was canceled last night.  I would like to know what we're going to do now."  I'm not sure at this time.  I have put in a call to the surgery scheduler at the surgical center.  I am waiting to get a response from her.  Once I know something, you'll be the first person I call to reschedule.  "Thank you so much.  I was really looking forward to this surgery.  My foot is killing me."

## 2018-12-29 NOTE — Telephone Encounter (Signed)
"  I'm calling to see if I'm able to reschedule my surgery.  My foot is killing me."  I can reschedule your surgery to April 16.  We are not able to do surgery for the next couple of weeks.  I found that out this morning.  "I'm not trying to be a pain.  I know I called on Friday.  Go ahead and put me down for April 16.  I'll have to check with my boss and see if that date is okay.  I'll call you back if it's not."

## 2018-12-31 ENCOUNTER — Other Ambulatory Visit: Payer: BC Managed Care – PPO

## 2019-01-09 ENCOUNTER — Telehealth: Payer: Self-pay | Admitting: *Deleted

## 2019-01-09 NOTE — Telephone Encounter (Signed)
I left [name] a message to call me back on her home number.  I informed her that we need to reschedule her January 22, 2019 appointment.  I offered her Feb 26, 2019.  I am calling to reschedule your surgery, once again.  I'm sorry.  "I figured I was going to get a call soon.  When can we do it?"  He can do it on Feb 26, 2019.  "Okay, we'll work through this.  Be safe."  Thank you, same to you as well.  I rescheduled her surgery from January 22, 2019 to Feb 26, 2019 via the surgical center's [facility].

## 2019-01-26 ENCOUNTER — Telehealth: Payer: Self-pay | Admitting: *Deleted

## 2019-01-26 NOTE — Telephone Encounter (Addendum)
The surgical center has reopened.  I can move your surgery up to this Thursday or next Thursday if you would like.  "I can't do it either week.  My job has already made out the schedule for a month.  We're working every other day.  I would like to do it on May 14, if I can."  He can do it on Feb 19, 2019.  I'll get it rescheduled.  "You have made my day by hearing this.  My foot has been killing me.  I woke up yesterday with it hurting me."  I rescheduled her surgery from 02/26/2019 to 02/19/2019 via the surgical center's [employer] Portal.

## 2019-01-29 ENCOUNTER — Other Ambulatory Visit: Payer: BC Managed Care – PPO

## 2019-02-05 ENCOUNTER — Other Ambulatory Visit: Payer: BC Managed Care – PPO

## 2019-02-19 ENCOUNTER — Other Ambulatory Visit: Payer: Self-pay | Admitting: Podiatry

## 2019-02-19 DIAGNOSIS — M2012 Hallux valgus (acquired), left foot: Secondary | ICD-10-CM

## 2019-02-19 MED ORDER — OXYCODONE-ACETAMINOPHEN 5-325 MG PO TABS: 1.0000 | ORAL_TABLET | Freq: Four times a day (QID) | ORAL | 0 refills | Status: DC | PRN

## 2019-02-19 NOTE — Progress Notes (Signed)
.  postop

## 2019-02-25 ENCOUNTER — Other Ambulatory Visit: Payer: Self-pay

## 2019-02-25 ENCOUNTER — Ambulatory Visit (INDEPENDENT_AMBULATORY_CARE_PROVIDER_SITE_OTHER): Payer: BC Managed Care – PPO | Admitting: Podiatry

## 2019-02-25 ENCOUNTER — Encounter: Payer: Self-pay | Admitting: Podiatry

## 2019-02-25 ENCOUNTER — Ambulatory Visit (INDEPENDENT_AMBULATORY_CARE_PROVIDER_SITE_OTHER): Payer: BC Managed Care – PPO

## 2019-02-25 VITALS — Temp 97.3°F

## 2019-02-25 DIAGNOSIS — Z09 Encounter for follow-up examination after completed treatment for conditions other than malignant neoplasm: Secondary | ICD-10-CM

## 2019-02-25 DIAGNOSIS — M2012 Hallux valgus (acquired), left foot: Secondary | ICD-10-CM

## 2019-02-25 MED ORDER — OXYCODONE-ACETAMINOPHEN 5-325 MG PO TABS: 1.0000 | ORAL_TABLET | Freq: Four times a day (QID) | ORAL | 0 refills | Status: DC | PRN

## 2019-03-01 NOTE — Progress Notes (Signed)
   Subjective:  Patient presents today status post bunionectomy left. DOS: 02/19/2019. She reports intermittent throbbing pain. She was taking Percocet for pain but states it causes nausea. She has not been able to bear much weight secondary to the pain. She has been using the CAM boot and crutches as directed. Patient is here for further evaluation and treatment.    History reviewed. No pertinent past medical history.    Objective/Physical Exam Neurovascular status intact.  Skin incisions appear to be well coapted with sutures and staples intact. No sign of infectious process noted. No dehiscence. No active bleeding noted. Moderate edema noted to the surgical extremity.  Radiographic Exam:  Orthopedic hardware and osteotomies sites appear to be stable with routine healing.  Assessment: 1. s/p bunionectomy left. DOS: 02/19/2019   Plan of Care:  1. Patient was evaluated. X-rays reviewed 2. Dressing changed. Keep clean, dry and intact for one week.  3. Continue weightbearing in CAM boot with crutches.  4. Refill prescription for Percocet 5/325 mg provided to patient.  5. Return to clinic in one week.   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    Felecia Shelling, DPM [facility]  Dr. Felecia Shelling, DPM    [address]                                        [place], [place] [address]                Office [phone]  Fax [phone]

## 2019-03-04 ENCOUNTER — Other Ambulatory Visit: Payer: Self-pay

## 2019-03-04 ENCOUNTER — Ambulatory Visit (INDEPENDENT_AMBULATORY_CARE_PROVIDER_SITE_OTHER): Payer: Self-pay | Admitting: Podiatry

## 2019-03-04 ENCOUNTER — Other Ambulatory Visit: Payer: BC Managed Care – PPO

## 2019-03-04 ENCOUNTER — Encounter: Payer: Self-pay | Admitting: Podiatry

## 2019-03-04 VITALS — Temp 96.8°F

## 2019-03-04 DIAGNOSIS — Z09 Encounter for follow-up examination after completed treatment for conditions other than malignant neoplasm: Secondary | ICD-10-CM

## 2019-03-04 DIAGNOSIS — M2012 Hallux valgus (acquired), left foot: Secondary | ICD-10-CM

## 2019-03-08 NOTE — Progress Notes (Signed)
   Subjective:  Patient presents today status post bunionectomy left. DOS: 02/19/2019. She states her foot is improving. She reports a decrease in swelling. She has been using her CAM boot as directed and denies modifying factors. Patient is here for further evaluation and treatment.    History reviewed. No pertinent past medical history.    Objective/Physical Exam Neurovascular status intact.  Skin incisions appear to be well coapted with sutures and staples intact. No sign of infectious process noted. No dehiscence. No active bleeding noted. Moderate edema noted to the surgical extremity.   Assessment: 1. s/p bunionectomy left. DOS: 02/19/2019   Plan of Care:  1. Patient was evaluated. 2. Sutures removed.  3. Continue weightbearing in CAM boot.  4. Compression anklet dispensed.  5. Return to clinic in 3 weeks.   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    Felecia Shelling, DPM [facility]  Dr. Felecia Shelling, DPM    [address]                                        [place], [place] [address]                Office [phone]  Fax [phone]

## 2019-03-18 ENCOUNTER — Ambulatory Visit (INDEPENDENT_AMBULATORY_CARE_PROVIDER_SITE_OTHER): Payer: BC Managed Care – PPO | Admitting: Podiatry

## 2019-03-18 ENCOUNTER — Ambulatory Visit (INDEPENDENT_AMBULATORY_CARE_PROVIDER_SITE_OTHER): Payer: BC Managed Care – PPO

## 2019-03-18 ENCOUNTER — Other Ambulatory Visit: Payer: Self-pay

## 2019-03-18 ENCOUNTER — Encounter: Payer: Self-pay | Admitting: Podiatry

## 2019-03-18 VITALS — Temp 97.7°F

## 2019-03-18 DIAGNOSIS — Z09 Encounter for follow-up examination after completed treatment for conditions other than malignant neoplasm: Secondary | ICD-10-CM

## 2019-03-18 DIAGNOSIS — M2012 Hallux valgus (acquired), left foot: Secondary | ICD-10-CM

## 2019-03-18 NOTE — Progress Notes (Signed)
This patient presents to the office following bunion surgery on 02/19/2019.  Her doctor is Dr. Amalia Hailey.  Patient states that she does have soreness in her big toe joint at the site of the surgery and some swelling around the surgical site.  She says she is wearing her compression sock as well as walking in her cam walker.  She states she is not having severe pain and discomfort at this time.  Patient states that she is doing a small amount of exercise to her big toe joint postoperatively.  She presents the office today for an evaluation of her left foot.  Neurovascular status intact for this patient.  Good wound correction and healing noted at the incision over the big toe joint.  Mild swelling noted in the first MPJ left foot.  Patient has unrestricted range of motion of the big toe joint left foot.  No evidence of any redness or infection noted from the surgical site.  S/P bunionectomy 1st MPJ  Left foot.  ROV.  AP and lateral x-rays of the foot reveal good positioning of the capital fragment and the screws are intact.  Discussed future treatment for this patient.  Patient was dispensed a postoperative shoe to be worn.  She was told that if pain occurs for her to return to her cam walker.  She is also interested in going to work but that needs to be determined by Dr. Amalia Hailey.  Patient to return to the office in 4 weeks for an evaluation by Dr. Charlynne Cousins DPM

## 2019-03-31 ENCOUNTER — Telehealth: Payer: Self-pay | Admitting: *Deleted

## 2019-03-31 NOTE — Telephone Encounter (Signed)
Pt states she had surgery 02/19/2019 and fell this morning and hurt the foot, she is icing and taking the pain medication and would like to know what to do.

## 2019-03-31 NOTE — Telephone Encounter (Signed)
Left message instructing pt to continue with the rest, ice and elevation, to go back in to the surgery boot, and make an appt to be seen tomorrow with Dr. Amalia Hailey.

## 2019-04-01 ENCOUNTER — Other Ambulatory Visit: Payer: Self-pay

## 2019-04-01 ENCOUNTER — Ambulatory Visit (INDEPENDENT_AMBULATORY_CARE_PROVIDER_SITE_OTHER): Payer: BC Managed Care – PPO | Admitting: Podiatry

## 2019-04-01 ENCOUNTER — Ambulatory Visit (INDEPENDENT_AMBULATORY_CARE_PROVIDER_SITE_OTHER): Payer: BC Managed Care – PPO

## 2019-04-01 VITALS — Temp 96.3°F

## 2019-04-01 DIAGNOSIS — M2012 Hallux valgus (acquired), left foot: Secondary | ICD-10-CM | POA: Diagnosis not present

## 2019-04-01 DIAGNOSIS — Z09 Encounter for follow-up examination after completed treatment for conditions other than malignant neoplasm: Secondary | ICD-10-CM

## 2019-04-01 MED ORDER — MELOXICAM 15 MG PO TABS: 15.0000 mg | ORAL_TABLET | Freq: Every day | ORAL | 1 refills | Status: AC

## 2019-04-01 NOTE — Patient Instructions (Signed)
Wear Boot for 1 week, then transition into good tennis shoes.   Work on moving the big toe joint up and DOWN daily. Increase range of motion to the great toe.

## 2019-04-04 NOTE — Progress Notes (Signed)
   Subjective:  Patient presents today status post bunionectomy left. DOS: 02/19/2019. She states she tripped and fell yesterday in the shower and is worried that she has re-injured her foot. Patient is here for further evaluation and treatment.    No past medical history on file.    Objective/Physical Exam Neurovascular status intact.  Skin incisions appear to be well coapted. No sign of infectious process noted. No dehiscence. No active bleeding noted. Moderate edema noted to the surgical extremity.  Radiographic Exam:  Orthopedic hardware and osteotomies sites appear to be stable with routine healing.  Assessment: 1. s/p bunionectomy left. DOS: 02/19/2019   Plan of Care:  1. Patient was evaluated. X-Rays reviewed.  2. Use CAM boot for one week, then transition into good shoe gear.  3. Begin range of motion exercises to great toe joint.  4. Patient set to return to work on 04/22/2019.  5. Return to clinic in 3 weeks, just prior to return to work date.   Works Museum/gallery curator at [employer].    Edrick Kins, DPM [facility]  Dr. Edrick Kins, [address]                                        [place], [place] [address]                Office [phone]  Fax [phone]

## 2019-04-15 ENCOUNTER — Encounter: Payer: BC Managed Care – PPO | Admitting: Podiatry

## 2019-04-20 ENCOUNTER — Encounter: Payer: Self-pay | Admitting: Podiatry

## 2019-04-20 ENCOUNTER — Ambulatory Visit (INDEPENDENT_AMBULATORY_CARE_PROVIDER_SITE_OTHER): Payer: BC Managed Care – PPO

## 2019-04-20 ENCOUNTER — Other Ambulatory Visit: Payer: Self-pay

## 2019-04-20 ENCOUNTER — Ambulatory Visit (INDEPENDENT_AMBULATORY_CARE_PROVIDER_SITE_OTHER): Payer: BC Managed Care – PPO | Admitting: Podiatry

## 2019-04-20 VITALS — Temp 97.2°F

## 2019-04-20 DIAGNOSIS — M2012 Hallux valgus (acquired), left foot: Secondary | ICD-10-CM | POA: Diagnosis not present

## 2019-04-20 DIAGNOSIS — Z09 Encounter for follow-up examination after completed treatment for conditions other than malignant neoplasm: Secondary | ICD-10-CM

## 2019-04-21 NOTE — Progress Notes (Signed)
   Subjective:  Patient presents today status post bunionectomy left. DOS: 02/19/2019. She states she is doing well and improving. She reports some mild swelling that is exacerbated by being on her feet for a long period of time. She has been wearing good shoe gear and doing range of motion exercises to the left hallux. Patient is here for further evaluation and treatment.  History reviewed. No pertinent past medical history.    Objective/Physical Exam Neurovascular status intact.  Skin incisions appear to be well coapted. No sign of infectious process noted. No dehiscence. No active bleeding noted. Moderate edema noted to the surgical extremity.  Radiographic Exam:  Orthopedic hardware and osteotomies sites appear to be stable with routine healing.  Assessment: 1. s/p bunionectomy left. DOS: 02/19/2019   Plan of Care:  1. Patient was evaluated. X-Rays reviewed.  2. Return to work with restrictions for four weeks. Note provided.  3. Recommended good shoe gear.  4. Return to clinic as needed.   Works Museum/gallery curator at [employer].    Edrick Kins, DPM [facility]  Dr. Edrick Kins, [place]                                        [place], [place] [address]                Office [phone]  Fax [phone]

## 2019-10-16 ENCOUNTER — Ambulatory Visit: Payer: BC Managed Care – PPO | Attending: Internal Medicine

## 2019-10-16 DIAGNOSIS — Z20822 Contact with and (suspected) exposure to covid-19: Secondary | ICD-10-CM

## 2019-10-18 LAB — NOVEL CORONAVIRUS, NAA: SARS-CoV-2, NAA: NOT DETECTED

## 2019-11-27 ENCOUNTER — Ambulatory Visit: Payer: BC Managed Care – PPO | Attending: Internal Medicine

## 2019-11-27 DIAGNOSIS — Z23 Encounter for immunization: Secondary | ICD-10-CM | POA: Insufficient documentation

## 2019-11-27 NOTE — Progress Notes (Signed)
   Covid-19 Vaccination Clinic  Name:  SHAKTI FLEER    MRN: [id number] DOB: [birth year]  11/27/2019  Ms. Tafolla was observed post Covid-19 immunization for 15 minutes without incidence. She was provided with Vaccine Information Sheet and instruction to access the V-Safe system.   Ms. Vignola was instructed to call 911 with any severe reactions post vaccine: Marland Kitchen Difficulty breathing  . Swelling of your face and throat  . A fast heartbeat  . A bad rash all over your body  . Dizziness and weakness    Immunizations Administered    Name Date Dose VIS Date Route   Pfizer COVID-19 Vaccine 11/27/2019  9:54 AM 0.3 mL 09/18/2019 Intramuscular   Manufacturer: [employer], [employer]   Lot: [id number]   NDC: [address]-1

## 2019-12-22 ENCOUNTER — Ambulatory Visit: Payer: BC Managed Care – PPO | Attending: Internal Medicine

## 2019-12-22 DIAGNOSIS — Z23 Encounter for immunization: Secondary | ICD-10-CM

## 2019-12-22 NOTE — Progress Notes (Signed)
   Covid-19 Vaccination Clinic  Name:  [name]    MRN: [id number] DOB: 08/17/1976  12/22/2019  [name] was observed post Covid-19 immunization for 15 minutes without incident. She was provided with Vaccine Information Sheet and instruction to access the V-Safe system.   [name] was instructed to call 911 with any severe reactions post vaccine: Marland Kitchen Difficulty breathing  . Swelling of face and throat  . A fast heartbeat  . A bad rash all over body  . Dizziness and weakness   Immunizations Administered    Name Date Dose VIS Date Route   Pfizer COVID-19 Vaccine 12/22/2019  8:25 AM 0.3 mL 09/18/2019 Intramuscular   Manufacturer: [employer], [employer]   Lot: [id number]   NDC: [address]-3

## 2020-02-25 ENCOUNTER — Emergency Department (HOSPITAL_COMMUNITY)
Admission: EM | Admit: 2020-02-25 | Discharge: 2020-02-25 | Disposition: A | Discharge status: Home / Self Care | Payer: BC Managed Care – PPO | Attending: Emergency Medicine | Admitting: Emergency Medicine

## 2020-02-25 ENCOUNTER — Other Ambulatory Visit: Payer: Self-pay

## 2020-02-25 ENCOUNTER — Emergency Department (HOSPITAL_COMMUNITY): Payer: BC Managed Care – PPO

## 2020-02-25 ENCOUNTER — Encounter (HOSPITAL_COMMUNITY): Payer: Self-pay | Admitting: *Deleted

## 2020-02-25 DIAGNOSIS — R109 Unspecified abdominal pain: Secondary | ICD-10-CM

## 2020-02-25 DIAGNOSIS — R1011 Right upper quadrant pain: Secondary | ICD-10-CM | POA: Diagnosis not present

## 2020-02-25 DIAGNOSIS — Z79899 Other long term (current) drug therapy: Secondary | ICD-10-CM | POA: Insufficient documentation

## 2020-02-25 LAB — URINALYSIS, ROUTINE W REFLEX MICROSCOPIC
Bilirubin Urine: NEGATIVE
Glucose, UA: NEGATIVE mg/dL
Hgb urine dipstick: NEGATIVE
Ketones, ur: NEGATIVE mg/dL
Leukocytes,Ua: NEGATIVE
Nitrite: NEGATIVE
Protein, ur: NEGATIVE mg/dL
Specific Gravity, Urine: 1.014 (ref 1.005–1.030)
pH: 7 (ref 5.0–8.0)

## 2020-02-25 LAB — COMPREHENSIVE METABOLIC PANEL
ALT: 30 U/L (ref 0–44)
AST: 26 U/L (ref 15–41)
Albumin: 3.7 g/dL (ref 3.5–5.0)
Alkaline Phosphatase: 44 U/L (ref 38–126)
Anion gap: 9 (ref 5–15)
BUN: 10 mg/dL (ref 6–20)
CO2: 23 mmol/L (ref 22–32)
Calcium: 9.2 mg/dL (ref 8.9–10.3)
Chloride: 107 mmol/L (ref 98–111)
Creatinine, Ser: 0.71 mg/dL (ref 0.44–1.00)
GFR calc Af Amer: >60 mL/min (ref >60)
GFR calc non Af Amer: >60 mL/min (ref >60)
Glucose, Bld: 156 mg/dL — ABNORMAL HIGH (ref 70–99)
Potassium: 4 mmol/L (ref 3.5–5.1)
Sodium: 139 mmol/L (ref 135–145)
Total Bilirubin: 1.1 mg/dL (ref 0.3–1.2)
Total Protein: 7.2 g/dL (ref 6.5–8.1)

## 2020-02-25 LAB — TROPONIN I (HIGH SENSITIVITY): Troponin I (High Sensitivity): 2 ng/L (ref <18)

## 2020-02-25 LAB — CBC
HCT: 41.1 % (ref 36.0–46.0)
Hemoglobin: 12.6 g/dL (ref 12.0–15.0)
MCH: 24.6 pg — ABNORMAL LOW (ref 26.0–34.0)
MCHC: 30.7 g/dL (ref 30.0–36.0)
MCV: 80.1 fL (ref 80.0–100.0)
Platelets: 263 10*3/uL (ref 150–400)
RBC: 5.13 MIL/uL — ABNORMAL HIGH (ref 3.87–5.11)
RDW: 14.1 % (ref 11.5–15.5)
WBC: 7.5 10*3/uL (ref 4.0–10.5)
nRBC: 0 % (ref 0.0–0.2)

## 2020-02-25 LAB — LIPASE, BLOOD: Lipase: 36 U/L (ref 11–51)

## 2020-02-25 LAB — I-STAT BETA HCG BLOOD, ED (MC, WL, AP ONLY): I-stat hCG, quantitative: <5 m[IU]/mL (ref <5)

## 2020-02-25 MED ORDER — DICYCLOMINE HCL 20 MG PO TABS
20.0000 mg | ORAL_TABLET | Freq: Two times a day (BID) | ORAL | 0 refills | Status: DC
Start: 2020-02-25 — End: 2020-08-01

## 2020-02-25 MED ORDER — ONDANSETRON HCL 4 MG/2ML IJ SOLN
4.0000 mg | Freq: Once | INTRAMUSCULAR | Status: AC
  Administered 2020-02-25: 4 mg via INTRAVENOUS
  Filled 2020-02-25: qty 2

## 2020-02-25 MED ORDER — SODIUM CHLORIDE 0.9 % IV BOLUS
1000.0000 mL | Freq: Once | INTRAVENOUS | Status: AC
  Administered 2020-02-25: 1000 mL via INTRAVENOUS

## 2020-02-25 MED ORDER — ONDANSETRON HCL 4 MG PO TABS: 4.0000 mg | ORAL_TABLET | Freq: Once | ORAL | Status: DC

## 2020-02-25 MED ORDER — MORPHINE SULFATE (PF) 2 MG/ML IV SOLN
2.0000 mg | Freq: Once | INTRAVENOUS | Status: AC
  Administered 2020-02-25: 2 mg via INTRAVENOUS
  Filled 2020-02-25: qty 1

## 2020-02-25 MED ORDER — IOHEXOL 300 MG/ML  SOLN
100.0000 mL | Freq: Once | INTRAMUSCULAR | Status: AC | PRN
  Administered 2020-02-25: 100 mL via INTRAVENOUS

## 2020-02-25 MED ORDER — ONDANSETRON HCL 4 MG PO TABS
4.0000 mg | ORAL_TABLET | Freq: Four times a day (QID) | ORAL | 0 refills | Status: DC
Start: 2020-02-25 — End: 2020-08-01

## 2020-02-25 MED ORDER — SODIUM CHLORIDE 0.9% FLUSH
3.0000 mL | Freq: Once | INTRAVENOUS | Status: AC
  Administered 2020-02-25: 3 mL via INTRAVENOUS

## 2020-02-25 NOTE — ED Triage Notes (Addendum)
To ED for eval of right side abd pain. States she has an appt with surgeon tomorrow for eval of hernia. States she's noticed that after she eats lately she has to have BM right away. Last BM this am and normal for her. Nothing makes pain worse or better. Started feeling this pain a month ago - getting worse. Nausea, no vomiting. Pt had colonoscopy on 5/7 - states results wnl.

## 2020-02-25 NOTE — Discharge Instructions (Addendum)
Your work-up today was overall reassuring.  Please make sure to keep your general surgery work-up.  There is no evidence of any infection, gallstones.  I have prescribed a nausea medication and an antispasmodic for pain. Please follow up with your GI if your symptoms do not improve.

## 2020-02-25 NOTE — ED Notes (Signed)
Pt transported to Ultrasound.  

## 2020-02-25 NOTE — ED Provider Notes (Signed)
[facility] Provider Note   CSN: [id number] Arrival date & time: 02/25/20  [id number]     History Chief Complaint  Patient presents with  . Abdominal Pain    [name] is a 44 y.o. female.  HPI  44 year old female with a history of uterine fibroids, umbilical hernia presents to the ER for right-sided abdominal pain.  Patient states that this pain has been waxing and waning on and off for the last month, but last night began to be more constant and more severe.  Rates the pain a 9/10.  She describes it as stabbing/throbbing and currently constant.  Nothing makes it worse or better, she has only taken Tylenol for her symptoms.  She had a colonoscopy on  5/7 which was within normal limits.  She has an appointment with a general surgeon to evaluate her umbilical hernia tomorrow.  She states that she found out she had an umbilical hernia 3 years ago, but the general surgeon stated that it was too small to operate on.  She states that lately she can only wear elastic pants secondary to the abdominal pain she feels from the pressure of any other type pant.  She refers that this pain feels typical to her hernia pain, but feels as though there is something else going on as she now has right upper quadrant pain as well.  She has had some intermittent nausea but no vomiting.  She has had no diarrhea but does note that her stool has been softer than normal, states that she feels as though she has to use the bathroom right after eating.  She denies any blood in her stool, diarrhea or constipation.  She also endorses chest pain that has been intermittent on and off, states that she will feel it when she is walking at work and she has to sit down and drink some water.  She then states that the pain goes away.  She also endorses shortness of breath with walking.  She refers that she uses the Depo shot as her method of birth control and to help prevent regrowth of her uterine  fibroids.  She did state that she had fibroid surgery back in 2018.  Denies any fevers, chills, headache, back pain, dysuria, hematuria, vaginal pain/discharge/bleeding.  History reviewed. No pertinent past medical history.  There are no problems to display for this patient.   Past Surgical History:  Procedure Laterality Date  . UTERINE FIBROID SURGERY       OB History   No obstetric history on file.     No family history on file.  Social History   Tobacco Use  . Smoking status: Never Smoker  . Smokeless tobacco: Never Used  Substance Use Topics  . Alcohol use: No  . Drug use: No    Home Medications Prior to Admission medications   Medication Sig Start Date End Date Taking? Authorizing Provider  acetaminophen (TYLENOL) 500 MG tablet Take 1,000 mg by mouth every 6 (six) hours as needed for mild pain.   Yes [provider]  Apple Cider Vinegar 500 MG TABS Take 500 mg by mouth daily.   Yes [provider]  DULoxetine (CYMBALTA) 60 MG capsule Take 60 mg by mouth daily.  10/24/18  Yes [provider]  medroxyPROGESTERone Acetate 150 MG/ML SUSY Inject 1 mL into the muscle as directed. 02/01/20  Yes [provider]  meloxicam (MOBIC) 15 MG tablet Take 1 tablet (15 mg total) by  mouth daily. Patient taking differently: Take 15 mg by mouth daily as needed for pain.  04/01/19  Yes Felecia Shelling, DPM  Multiple Vitamins-Minerals (MULTIVITAMIN WITH MINERALS) tablet Take 1 tablet by mouth daily.   Yes [provider]  pregabalin (LYRICA) 100 MG capsule Take 100 mg by mouth every evening.    Yes [provider]  SYNJARDY XR 07-999 MG TB24 Take 1 tablet by mouth daily. 01/06/20  Yes [provider]  Alum & Mag Hydroxide-Simeth (MAGIC MOUTHWASH W/LIDOCAINE) SOLN Take 5 mLs by mouth 3 (three) times daily as needed. Patient not taking: Reported on 02/25/2020 08/18/11   Domenick Gong, MD  dicyclomine (BENTYL) 20 MG tablet Take 1  tablet (20 mg total) by mouth 2 (two) times daily. 02/25/20   Mare Ferrari, PA-C  fluticasone (FLONASE) 50 MCG/ACT nasal spray Place 2 sprays into the nose daily. 08/18/11 08/17/12  Domenick Gong, MD  HYDROcodone-acetaminophen (NORCO/VICODIN) 5-325 MG per tablet Take 1 tablet by mouth every 4 (four) hours as needed. Patient not taking: Reported on 02/25/2020 11/30/14   Rolland Porter, MD  ondansetron (ZOFRAN) 4 MG tablet Take 1 tablet (4 mg total) by mouth every 6 (six) hours. 02/25/20   Mare Ferrari, PA-C  oxyCODONE-acetaminophen (PERCOCET) 5-325 MG tablet Take 1 tablet by mouth every 6 (six) hours as needed for severe pain. Patient not taking: Reported on 02/25/2020 02/25/19   Felecia Shelling, DPM    Allergies    Patient has no known allergies.  Review of Systems   Review of Systems  Constitutional: Negative for chills and fever.  HENT: Negative for ear pain and sore throat.   Eyes: Negative for pain and visual disturbance.  Respiratory: Positive for shortness of breath. Negative for cough.   Cardiovascular: Positive for chest pain. Negative for palpitations.  Gastrointestinal: Positive for abdominal pain and nausea. Negative for diarrhea and vomiting.  Genitourinary: Negative for dysuria, hematuria, urgency, vaginal bleeding, vaginal discharge and vaginal pain.  Musculoskeletal: Negative for arthralgias and back pain.  Skin: Negative for color change and rash.  Neurological: Negative for dizziness, seizures, syncope, weakness, numbness and headaches.  Psychiatric/Behavioral: Negative for confusion.  All other systems reviewed and are negative.   Physical Exam Updated Vital Signs BP 131/84 (BP Location: Right Arm)   Pulse 77   Temp 98.7 F (37.1 C) (Oral)   Resp 19   Ht 5\' 1"  (1.549 m)   Wt 83.9 kg   SpO2 98%   BMI 34.96 kg/m   Physical Exam Vitals and nursing note reviewed.  Constitutional:      General: She is not in acute distress.    Appearance: She is  well-developed. She is not ill-appearing, toxic-appearing or diaphoretic.  HENT:     Head: Normocephalic and atraumatic.     Mouth/Throat:     Mouth: Mucous membranes are moist.     Pharynx: Oropharynx is clear.  Eyes:     Extraocular Movements: Extraocular movements intact.     Conjunctiva/sclera: Conjunctivae normal.     Pupils: Pupils are equal, round, and reactive to light.  Cardiovascular:     Rate and Rhythm: Normal rate and regular rhythm.     Heart sounds: Normal heart sounds. No murmur.  Pulmonary:     Effort: Pulmonary effort is normal. No respiratory distress.     Breath sounds: Normal breath sounds.  Abdominal:     General: Abdomen is protuberant. Bowel sounds are normal.     Palpations: Abdomen is soft.  Tenderness: There is abdominal tenderness in the right upper quadrant and right lower quadrant. There is guarding. There is no right CVA tenderness or left CVA tenderness. Positive signs include Murphy's sign. Negative signs include McBurney's sign.     Hernia: A hernia is present. Hernia is present in the umbilical area.  Musculoskeletal:     Cervical back: Neck supple.  Skin:    General: Skin is warm and dry.     Capillary Refill: Capillary refill takes less than 2 seconds.     Coloration: Skin is not jaundiced.     Findings: No erythema or rash.  Neurological:     General: No focal deficit present.     Mental Status: She is alert.  Psychiatric:        Mood and Affect: Mood normal.        Behavior: Behavior normal.     ED Results / Procedures / Treatments   Labs (all labs ordered are listed, but only abnormal results are displayed) Labs Reviewed  COMPREHENSIVE METABOLIC PANEL - Abnormal; Notable for the following components:      Result Value   Glucose, Bld 156 (*)    All other components within normal limits  CBC - Abnormal; Notable for the following components:   RBC 5.13 (*)    MCH 24.6 (*)    All other components within normal limits  LIPASE,  BLOOD  URINALYSIS, ROUTINE W REFLEX MICROSCOPIC  I-STAT BETA HCG BLOOD, ED ([facility], [facility], [facility] ONLY)  TROPONIN I (HIGH SENSITIVITY)  TROPONIN I (HIGH SENSITIVITY)    EKG None  Radiology CT ABDOMEN PELVIS W CONTRAST  Result Date: 02/25/2020 CLINICAL DATA:  Right side abdominal pain EXAM: CT ABDOMEN AND PELVIS WITH CONTRAST TECHNIQUE: Multidetector CT imaging of the abdomen and pelvis was performed using the standard protocol following bolus administration of intravenous contrast. CONTRAST:  123mL OMNIPAQUE IOHEXOL 300 MG/ML  SOLN COMPARISON:  01/14/2010 FINDINGS: Lower chest: Lung bases are clear. No effusions. Heart is normal size. Hepatobiliary: Diffuse low-density throughout the liver compatible with fatty infiltration. No focal abnormality. Gallbladder unremarkable. Pancreas: No focal abnormality or ductal dilatation. Spleen: No focal abnormality.  Normal size. Adrenals/Urinary Tract: No adrenal abnormality. No focal renal abnormality. No stones or hydronephrosis. Urinary bladder is unremarkable. Stomach/Bowel: Normal appendix. Stomach, large and small bowel grossly unremarkable. Vascular/Lymphatic: No evidence of aneurysm or adenopathy. Reproductive: Small enhancing areas within the uterus, likely small fibroids. No adnexal mass. Other: No free fluid or free air. Musculoskeletal: No acute bony abnormality. IMPRESSION: Mild fatty infiltration of the liver. Normal appendix. Probable small uterine fibroids. No acute findings in the abdomen or pelvis. Electronically Signed   By: Rolm Baptise M.D.   On: 02/25/2020 12:08   DG Chest Portable 1 View  Result Date: 02/25/2020 CLINICAL DATA:  Shortness of breath. EXAM: PORTABLE CHEST 1 VIEW COMPARISON:  Rib series 08/11/2008. FINDINGS: Mediastinum and hilar structures normal. Lungs are clear. No pleural effusion or pneumothorax. Heart size normal. No acute bony abnormality. IMPRESSION: No acute cardiopulmonary disease. Electronically Signed   By: Marcello Moores  Register    On: 02/25/2020 10:49   US Abdomen Limited RUQ  Result Date: 02/25/2020 CLINICAL DATA:  Right upper quadrant abdominal pain since last evening. EXAM: ULTRASOUND ABDOMEN LIMITED RIGHT UPPER QUADRANT COMPARISON:  Abdominal ultrasound 07/04/2017 FINDINGS: Gallbladder: No gallstones or wall thickening visualized. No sonographic Murphy sign noted by sonographer. Common bile duct: Diameter: 3.8 mm Liver: Normal echogenicity without focal lesion or biliary dilatation. Portal vein is patent  on color Doppler imaging with normal direction of blood flow towards the liver. Other: None. IMPRESSION: Unremarkable right upper quadrant ultrasound examination Electronically Signed   By: Rudie Meyer M.D.   On: 02/25/2020 11:03    Procedures Procedures (including critical care time)  Medications Ordered in ED Medications  sodium chloride flush (NS) 0.9 % injection 3 mL (3 mLs Intravenous Given 02/25/20 1114)  sodium chloride 0.9 % bolus 1,000 mL (1,000 mLs Intravenous Bolus from Bag 02/25/20 1114)  ondansetron (ZOFRAN) injection 4 mg (4 mg Intravenous Given 02/25/20 1114)  morphine 2 MG/ML injection 2 mg (2 mg Intravenous Given 02/25/20 1115)  iohexol (OMNIPAQUE) 300 MG/ML solution 100 mL (100 mLs Intravenous Contrast Given 02/25/20 1200)    ED Course  I have reviewed the triage vital signs and the nursing notes.  Pertinent labs & imaging results that were available during my care of the patient were reviewed by me and considered in my medical decision making (see chart for details).    MDM Rules/Calculators/A&P                     44 year old female with right-sided abdominal pain x1 day. On presentation to the ER, the patient is alert and oriented, nontoxic-appearing, sitting comfortably in ER bed.  She is not actively vomiting.  Patient normotensive on arrival to the ED, though she did become slightly hypertensive which I suspect is due to pain as she does not have a history of hypertension.  Blood pressures  improved throughout ED course with pain control.  Other vitals reassuring.  CMP without significant electrode abnormalities, mildly elevated glucose of 156.  BUN/creatinine normal.  AST ALT within normal limits.  UA without evidence of UTI.  CBC without leukocytosis, normal hemoglobin.  Lipase normal.  Given significant right-sided abdominal pain and right upper and lower quadrant, along with positive Murphy sign and significant right upper quadrant tenderness, will order CT abdomen and right upper quadrant ultrasound.  Although I do not suspect that she is having cardiac related chest pain, I will order troponin, EKG and chest x-ray to rule out cardiogenic sources.  Patient treated with pain medication, Zofran, fluid bolus.  11:37 AM: Right upper quadrant ultrasound negative. Chest x-ray without acute abnormalities.  12:20 PM: CT with mild fatty liver infiltration, and small uterine fibroids.  No acute intra-abdominal process.   1:15 PM: On serial abdominal examination, patient notes improvement of symptoms with morphine and Zofran and fluids.  I encouraged her to continue her follow-up with the general surgeon tomorrow.  I do not think she has a PE as she does not have any risk factors and vitals are overall reasurring.  Troponin negative.  I discussed the findings with the patient and she is overall reassured.  I have provided her with a prescription for Bentyl and Zofran.  I encouraged her to follow-up with her GI if her symptoms do not improve.  She voices understanding and is agreeable to this plan.  At this stage in the ED course, the patient has been adequately screened and is stable for discharge.  Return precautions given. Final Clinical Impression(s) / ED Diagnoses Final diagnoses:  RUQ pain  Abdominal pain, unspecified abdominal location    Rx / DC Orders ED Discharge Orders         Ordered    dicyclomine (BENTYL) 20 MG tablet  2 times daily     02/25/20 1211    ondansetron (ZOFRAN) 4  MG tablet  Every  6 hours     02/25/20 1211           Leone Brand 02/25/20 1330    Tilden Fossa, MD 02/25/20 [phone]

## 2020-02-25 NOTE — ED Notes (Signed)
Pt transported to CT ?

## 2020-03-08 ENCOUNTER — Other Ambulatory Visit (HOSPITAL_COMMUNITY): Payer: Self-pay | Admitting: Surgery

## 2020-03-08 ENCOUNTER — Other Ambulatory Visit: Payer: Self-pay | Admitting: Surgery

## 2020-03-08 DIAGNOSIS — R1011 Right upper quadrant pain: Secondary | ICD-10-CM

## 2020-03-18 ENCOUNTER — Ambulatory Visit (HOSPITAL_COMMUNITY)
Admission: RE | Admit: 2020-03-18 | Discharge: 2020-03-18 | Disposition: A | Discharge status: Home / Self Care | Payer: BC Managed Care – PPO | Source: Ambulatory Visit | Attending: Surgery | Admitting: Surgery

## 2020-03-18 DIAGNOSIS — R1011 Right upper quadrant pain: Secondary | ICD-10-CM

## 2020-03-18 MED ORDER — TECHNETIUM TC 99M MEBROFENIN IV KIT
5.1000 | PACK | Freq: Once | INTRAVENOUS | Status: AC | PRN
  Administered 2020-03-18: 5.1 via INTRAVENOUS

## 2020-04-01 ENCOUNTER — Other Ambulatory Visit: Payer: Self-pay

## 2020-04-01 ENCOUNTER — Emergency Department (HOSPITAL_COMMUNITY): Payer: BC Managed Care – PPO

## 2020-04-01 ENCOUNTER — Emergency Department (HOSPITAL_COMMUNITY)
Admission: EM | Admit: 2020-04-01 | Discharge: 2020-04-01 | Disposition: A | Discharge status: Home / Self Care | Payer: BC Managed Care – PPO | Attending: Emergency Medicine | Admitting: Emergency Medicine

## 2020-04-01 DIAGNOSIS — I10 Essential (primary) hypertension: Secondary | ICD-10-CM | POA: Insufficient documentation

## 2020-04-01 DIAGNOSIS — Z5321 Procedure and treatment not carried out due to patient leaving prior to being seen by health care provider: Secondary | ICD-10-CM | POA: Diagnosis not present

## 2020-04-01 DIAGNOSIS — R079 Chest pain, unspecified: Secondary | ICD-10-CM | POA: Diagnosis present

## 2020-04-01 LAB — BASIC METABOLIC PANEL
Anion gap: 12 (ref 5–15)
BUN: 10 mg/dL (ref 6–20)
CO2: 22 mmol/L (ref 22–32)
Calcium: 9.1 mg/dL (ref 8.9–10.3)
Chloride: 106 mmol/L (ref 98–111)
Creatinine, Ser: 0.78 mg/dL (ref 0.44–1.00)
GFR calc Af Amer: >60 mL/min (ref >60)
GFR calc non Af Amer: >60 mL/min (ref >60)
Glucose, Bld: 104 mg/dL — ABNORMAL HIGH (ref 70–99)
Potassium: 3.7 mmol/L (ref 3.5–5.1)
Sodium: 140 mmol/L (ref 135–145)

## 2020-04-01 LAB — I-STAT BETA HCG BLOOD, ED (MC, WL, AP ONLY): I-stat hCG, quantitative: <5 m[IU]/mL (ref <5)

## 2020-04-01 LAB — CBC
HCT: 40.8 % (ref 36.0–46.0)
Hemoglobin: 12.4 g/dL (ref 12.0–15.0)
MCH: 24.3 pg — ABNORMAL LOW (ref 26.0–34.0)
MCHC: 30.4 g/dL (ref 30.0–36.0)
MCV: 79.8 fL — ABNORMAL LOW (ref 80.0–100.0)
Platelets: 202 10*3/uL (ref 150–400)
RBC: 5.11 MIL/uL (ref 3.87–5.11)
RDW: 13.7 % (ref 11.5–15.5)
WBC: 8 10*3/uL (ref 4.0–10.5)
nRBC: 0 % (ref 0.0–0.2)

## 2020-04-01 LAB — TROPONIN I (HIGH SENSITIVITY)
Troponin I (High Sensitivity): <2 ng/L (ref <18)
Troponin I (High Sensitivity): <2 ng/L (ref <18)

## 2020-04-01 MED ORDER — SODIUM CHLORIDE 0.9% FLUSH: 3.0000 mL | Freq: Once | INTRAVENOUS | Status: DC

## 2020-04-01 NOTE — ED Notes (Signed)
Pt stated that she could not wait any longer, and wanted to go home.  Stated that she is feeling better and has a doctors appt Tuesday. I informed her that no results required her wait to be expedited and to return if symptoms returned.

## 2020-04-01 NOTE — ED Triage Notes (Signed)
Pt bib ems from work with hypertension 220/104 with associated CP. HR 90's. Pt with hx of hypertension but is no longer taking medications. 12 lead unremarkable for ems. 20g LAC. Given 325mg  asa pta. On arrival to the ED 164/80.

## 2020-04-01 NOTE — ED Notes (Signed)
Family at bedside. 

## 2020-04-12 ENCOUNTER — Other Ambulatory Visit: Payer: Self-pay | Admitting: Surgery

## 2020-05-20 ENCOUNTER — Other Ambulatory Visit: Payer: Self-pay | Admitting: Surgery

## 2020-06-14 ENCOUNTER — Other Ambulatory Visit: Payer: Self-pay | Admitting: Critical Care Medicine

## 2020-06-14 ENCOUNTER — Other Ambulatory Visit: Payer: Self-pay

## 2020-06-14 DIAGNOSIS — Z20822 Contact with and (suspected) exposure to covid-19: Secondary | ICD-10-CM

## 2020-06-15 LAB — SARS-COV-2, NAA 2 DAY TAT

## 2020-06-15 LAB — NOVEL CORONAVIRUS, NAA: SARS-CoV-2, NAA: NOT DETECTED

## 2020-08-01 ENCOUNTER — Other Ambulatory Visit: Payer: Self-pay

## 2020-08-01 ENCOUNTER — Encounter (HOSPITAL_BASED_OUTPATIENT_CLINIC_OR_DEPARTMENT_OTHER): Payer: Self-pay | Admitting: Surgery

## 2020-08-05 ENCOUNTER — Encounter (HOSPITAL_BASED_OUTPATIENT_CLINIC_OR_DEPARTMENT_OTHER)
Admission: RE | Admit: 2020-08-05 | Discharge: 2020-08-05 | Disposition: A | Discharge status: Home / Self Care | Payer: BC Managed Care – PPO | Source: Ambulatory Visit | Attending: Surgery | Admitting: Surgery

## 2020-08-05 ENCOUNTER — Other Ambulatory Visit (HOSPITAL_COMMUNITY)
Admission: RE | Admit: 2020-08-05 | Discharge: 2020-08-05 | Disposition: A | Discharge status: Home / Self Care | Payer: BC Managed Care – PPO | Source: Ambulatory Visit | Attending: Surgery | Admitting: Surgery

## 2020-08-05 ENCOUNTER — Other Ambulatory Visit: Payer: Self-pay

## 2020-08-05 DIAGNOSIS — Z01812 Encounter for preprocedural laboratory examination: Secondary | ICD-10-CM | POA: Insufficient documentation

## 2020-08-05 DIAGNOSIS — K429 Umbilical hernia without obstruction or gangrene: Secondary | ICD-10-CM | POA: Diagnosis present

## 2020-08-05 DIAGNOSIS — K420 Umbilical hernia with obstruction, without gangrene: Secondary | ICD-10-CM | POA: Diagnosis not present

## 2020-08-05 DIAGNOSIS — Z20822 Contact with and (suspected) exposure to covid-19: Secondary | ICD-10-CM | POA: Insufficient documentation

## 2020-08-05 LAB — BASIC METABOLIC PANEL
Anion gap: 10 (ref 5–15)
BUN: 10 mg/dL (ref 6–20)
CO2: 23 mmol/L (ref 22–32)
Calcium: 9.2 mg/dL (ref 8.9–10.3)
Chloride: 107 mmol/L (ref 98–111)
Creatinine, Ser: 0.73 mg/dL (ref 0.44–1.00)
GFR, Estimated: >60 mL/min (ref >60)
Glucose, Bld: 95 mg/dL (ref 70–99)
Potassium: 4.4 mmol/L (ref 3.5–5.1)
Sodium: 140 mmol/L (ref 135–145)

## 2020-08-05 LAB — SARS CORONAVIRUS 2 (TAT 6-24 HRS): SARS Coronavirus 2: NEGATIVE

## 2020-08-05 MED ORDER — CHLORHEXIDINE GLUCONATE CLOTH 2 % EX PADS: 6.0000 | MEDICATED_PAD | Freq: Once | CUTANEOUS | Status: DC

## 2020-08-05 NOTE — Progress Notes (Signed)
      Enhanced Recovery after Surgery for Orthopedics Enhanced Recovery after Surgery is a protocol used to improve the stress on your body and your recovery after surgery.  Patient Instructions  . The night before surgery:  o No food after midnight. ONLY clear liquids after midnight  . The day of surgery (if you do NOT have diabetes):  o Drink ONE (1) Pre-Surgery Clear Ensure as directed.   o This drink was given to you during your hospital  pre-op appointment visit. o The pre-op nurse will instruct you on the time to drink the  Pre-Surgery Ensure depending on your surgery time. o Finish the drink at the designated time by the pre-op nurse.  o Nothing else to drink after completing the  Pre-Surgery Clear Ensure.  . The day of surgery (if you have diabetes): o Drink ONE (1) Gatorade 2 (G2) as directed. o This drink was given to you during your hospital  pre-op appointment visit.  o The pre-op nurse will instruct you on the time to drink the   Gatorade 2 (G2) depending on your surgery time. o Color of the Gatorade may vary. Red is not allowed. o Nothing else to drink after completing the  Gatorade 2 (G2).   Surgical soap given to patient, instructions given, patient verbalized understanding.          If you have questions, please contact your surgeon's office. 

## 2020-08-07 NOTE — H&P (Signed)
   [name]  Location: [facility] Patient #: [id number] DOB: 1976-08-27 Single / Language: [place] / Race: Black or African American Female   History of Present Illness   The patient is a 44 year old female who presents with abdominal pain.  She is here for another follow-up. She continues to have right upper quadrant and epigastric abdominal pain with nausea. Her CT scan was only remarkable for a fibroid uterus and a tiny umbilical hernia. She has no ulcers on ultrasound. Her HIDA scan showed a 61% gallbladder ejection fraction. Her symptoms still occur after fatty meals and are consistent with biliary colic. She denies jaundice. She is being treated for her diabetes and intermittent hypertension by her primary care physician.   Allergies No Known Drug Allergies   meds: Pregabalin (100MG  Capsule, Oral) Active. Cymbalta (60MG  Capsule DR Part, Oral) Active. Medications Reconciled  Vitals [facility] 04/12/2020 2:43 PM Weight: 188.4 lb Height: 61in Body Surface Area: 1.84 m Body Mass Index: 35.6 kg/m  Pulse: 102 (Regular)  BP: 132/84(Sitting, Left Arm, Standard)       Physical Exam (Natarsha Hurwitz A. [facility] MD The physical exam findings are as follows: Note: She appears well today. Her abdomen is soft and nontender today. There are no masses. The umbilical hernia is tiny and easily reducible.    Assessment & Plan   COLICKY RUQ ABDOMINAL PAIN (R10.11) UMBILICAL HERNIA  Impression: Given the persistence of her symptoms, I do believe she has biliary colic and possible chronic cholecystitis. Again her workup is completely unremarkable for any other abnormalities including endoscopy. We discussed continued expected management versus proceeding with a cholecystectomy. She is definitely leaning towards surgery. I again discussed in detail. She is going to discuss this again with her family prior to scheduling any surgery. She will call 06/13/2020  if she has made her decision and then we can proceed with orders for surgery.  Addendum: She now wishes to proceed with just an umbilical hernia repair with possible mesh.  I discussed the procedure and the risks.  The risks include but are not limited to bleeding, infection, injury to surrounding structures, use of mesh, the chance this does not resolve her symptoms, cardiopulmonary issues, etc. She agrees to proceed

## 2020-08-08 ENCOUNTER — Encounter (HOSPITAL_BASED_OUTPATIENT_CLINIC_OR_DEPARTMENT_OTHER): Payer: Self-pay | Admitting: Surgery

## 2020-08-08 ENCOUNTER — Other Ambulatory Visit: Payer: Self-pay

## 2020-08-08 ENCOUNTER — Ambulatory Visit (HOSPITAL_BASED_OUTPATIENT_CLINIC_OR_DEPARTMENT_OTHER)
Admission: RE | Admit: 2020-08-08 | Discharge: 2020-08-08 | Disposition: A | Discharge status: Home / Self Care | Payer: BC Managed Care – PPO | Attending: Surgery | Admitting: Surgery

## 2020-08-08 ENCOUNTER — Encounter (HOSPITAL_BASED_OUTPATIENT_CLINIC_OR_DEPARTMENT_OTHER)
Admission: RE | Disposition: A | Discharge status: Home / Self Care | Payer: Self-pay | Source: Home / Self Care | Attending: Surgery

## 2020-08-08 ENCOUNTER — Ambulatory Visit (HOSPITAL_BASED_OUTPATIENT_CLINIC_OR_DEPARTMENT_OTHER): Payer: BC Managed Care – PPO | Admitting: Anesthesiology

## 2020-08-08 DIAGNOSIS — K429 Umbilical hernia without obstruction or gangrene: Secondary | ICD-10-CM | POA: Diagnosis not present

## 2020-08-08 DIAGNOSIS — K420 Umbilical hernia with obstruction, without gangrene: Secondary | ICD-10-CM | POA: Insufficient documentation

## 2020-08-08 HISTORY — PX: UMBILICAL HERNIA REPAIR: SHX196

## 2020-08-08 HISTORY — DX: Depression, unspecified: F32.A

## 2020-08-08 HISTORY — DX: Essential (primary) hypertension: I10

## 2020-08-08 HISTORY — DX: Type 2 diabetes mellitus without complications: E11.9

## 2020-08-08 LAB — GLUCOSE, CAPILLARY
Glucose-Capillary: 138 mg/dL — ABNORMAL HIGH (ref 70–99)
Glucose-Capillary: 147 mg/dL — ABNORMAL HIGH (ref 70–99)

## 2020-08-08 LAB — POCT PREGNANCY, URINE: Preg Test, Ur: NEGATIVE

## 2020-08-08 SURGERY — REPAIR, HERNIA, UMBILICAL, ADULT
Anesthesia: General | Site: Abdomen

## 2020-08-08 MED ORDER — GABAPENTIN 300 MG PO CAPS
300.0000 mg | ORAL_CAPSULE | ORAL | Status: AC
  Administered 2020-08-08: 300 mg via ORAL

## 2020-08-08 MED ORDER — CELECOXIB 200 MG PO CAPS
ORAL_CAPSULE | ORAL | Status: AC
  Filled 2020-08-08: qty 1

## 2020-08-08 MED ORDER — MIDAZOLAM HCL 2 MG/2ML IJ SOLN
INTRAMUSCULAR | Status: DC | PRN
  Administered 2020-08-08: 2 mg via INTRAVENOUS

## 2020-08-08 MED ORDER — MIDAZOLAM HCL 2 MG/2ML IJ SOLN
INTRAMUSCULAR | Status: AC
  Filled 2020-08-08: qty 2

## 2020-08-08 MED ORDER — CEFAZOLIN SODIUM-DEXTROSE 2-4 GM/100ML-% IV SOLN
INTRAVENOUS | Status: AC
  Filled 2020-08-08: qty 100

## 2020-08-08 MED ORDER — LACTATED RINGERS IV SOLN: INTRAVENOUS | Status: DC

## 2020-08-08 MED ORDER — GABAPENTIN 300 MG PO CAPS
ORAL_CAPSULE | ORAL | Status: AC
  Filled 2020-08-08: qty 1

## 2020-08-08 MED ORDER — LIDOCAINE 2% (20 MG/ML) 5 ML SYRINGE
INTRAMUSCULAR | Status: AC
  Filled 2020-08-08: qty 5

## 2020-08-08 MED ORDER — FENTANYL CITRATE (PF) 100 MCG/2ML IJ SOLN
INTRAMUSCULAR | Status: AC
  Filled 2020-08-08: qty 2

## 2020-08-08 MED ORDER — ENSURE PRE-SURGERY PO LIQD: 296.0000 mL | Freq: Once | ORAL | Status: DC

## 2020-08-08 MED ORDER — ONDANSETRON HCL 4 MG/2ML IJ SOLN: 4.0000 mg | Freq: Once | INTRAMUSCULAR | Status: DC | PRN

## 2020-08-08 MED ORDER — ONDANSETRON HCL 4 MG/2ML IJ SOLN
INTRAMUSCULAR | Status: DC | PRN
  Administered 2020-08-08: 4 mg via INTRAVENOUS

## 2020-08-08 MED ORDER — PROPOFOL 500 MG/50ML IV EMUL
INTRAVENOUS | Status: AC
  Filled 2020-08-08: qty 100

## 2020-08-08 MED ORDER — OXYCODONE HCL 5 MG PO TABS: 5.0000 mg | ORAL_TABLET | Freq: Four times a day (QID) | ORAL | 0 refills | Status: AC | PRN

## 2020-08-08 MED ORDER — DEXAMETHASONE SODIUM PHOSPHATE 10 MG/ML IJ SOLN
INTRAMUSCULAR | Status: DC | PRN
  Administered 2020-08-08: 5 mg via INTRAVENOUS

## 2020-08-08 MED ORDER — FENTANYL CITRATE (PF) 100 MCG/2ML IJ SOLN
INTRAMUSCULAR | Status: DC | PRN
Start: 2020-08-08 — End: 2020-08-08
  Administered 2020-08-08 (×2): 50 ug via INTRAVENOUS

## 2020-08-08 MED ORDER — CEFAZOLIN SODIUM-DEXTROSE 2-4 GM/100ML-% IV SOLN
2.0000 g | INTRAVENOUS | Status: AC
  Administered 2020-08-08: 2 g via INTRAVENOUS

## 2020-08-08 MED ORDER — DEXAMETHASONE SODIUM PHOSPHATE 10 MG/ML IJ SOLN
INTRAMUSCULAR | Status: AC
  Filled 2020-08-08: qty 1

## 2020-08-08 MED ORDER — ACETAMINOPHEN 500 MG PO TABS
1000.0000 mg | ORAL_TABLET | ORAL | Status: AC
  Administered 2020-08-08: 1000 mg via ORAL

## 2020-08-08 MED ORDER — CELECOXIB 200 MG PO CAPS
400.0000 mg | ORAL_CAPSULE | ORAL | Status: AC
  Administered 2020-08-08: 400 mg via ORAL

## 2020-08-08 MED ORDER — LIDOCAINE 2% (20 MG/ML) 5 ML SYRINGE
INTRAMUSCULAR | Status: DC | PRN
  Administered 2020-08-08: 100 mg via INTRAVENOUS

## 2020-08-08 MED ORDER — BUPIVACAINE-EPINEPHRINE 0.5% -1:200000 IJ SOLN
INTRAMUSCULAR | Status: DC | PRN
  Administered 2020-08-08: 20 mL

## 2020-08-08 MED ORDER — OXYCODONE HCL 5 MG PO TABS: 5.0000 mg | ORAL_TABLET | Freq: Once | ORAL | Status: DC | PRN

## 2020-08-08 MED ORDER — OXYCODONE HCL 5 MG/5ML PO SOLN: 5.0000 mg | Freq: Once | ORAL | Status: DC | PRN

## 2020-08-08 MED ORDER — ONDANSETRON HCL 4 MG/2ML IJ SOLN
INTRAMUSCULAR | Status: AC
  Filled 2020-08-08: qty 2

## 2020-08-08 MED ORDER — HYDROMORPHONE HCL 1 MG/ML IJ SOLN: 0.2500 mg | INTRAMUSCULAR | Status: DC | PRN

## 2020-08-08 MED ORDER — ACETAMINOPHEN 500 MG PO TABS
ORAL_TABLET | ORAL | Status: AC
  Filled 2020-08-08: qty 2

## 2020-08-08 MED ORDER — PROPOFOL 10 MG/ML IV BOLUS
INTRAVENOUS | Status: DC | PRN
  Administered 2020-08-08: 200 mg via INTRAVENOUS

## 2020-08-08 SURGICAL SUPPLY — 40 items
ADH SKN CLS APL DERMABOND .7 (GAUZE/BANDAGES/DRESSINGS) ×1
APL PRP STRL LF DISP 70% ISPRP (MISCELLANEOUS) ×1
BLADE SURG 15 STRL LF DISP TIS (BLADE) ×1 IMPLANT
BLADE SURG 15 STRL SS (BLADE) ×2
CANISTER SUCT 1200ML W/VALVE (MISCELLANEOUS) IMPLANT
CHLORAPREP W/TINT 26 (MISCELLANEOUS) ×2 IMPLANT
COVER BACK TABLE 60X90IN (DRAPES) ×2 IMPLANT
COVER MAYO STAND STRL (DRAPES) ×2 IMPLANT
DERMABOND ADVANCED (GAUZE/BANDAGES/DRESSINGS) ×1
DERMABOND ADVANCED .7 DNX12 (GAUZE/BANDAGES/DRESSINGS) ×2 IMPLANT
DRAPE LAPAROTOMY 100X72 PEDS (DRAPES) ×2 IMPLANT
DRAPE UTILITY XL STRL (DRAPES) ×2 IMPLANT
ELECT REM PT RETURN 9FT ADLT (ELECTROSURGICAL) ×2
ELECTRODE REM PT RTRN 9FT ADLT (ELECTROSURGICAL) ×1 IMPLANT
GLOVE SURG SIGNA 7.5 PF LTX (GLOVE) ×2 IMPLANT
GLOVE SURG SS PI 6.5 STRL IVOR (GLOVE) ×1 IMPLANT
GOWN STRL REUS W/ TWL LRG LVL3 (GOWN DISPOSABLE) ×1 IMPLANT
GOWN STRL REUS W/ TWL XL LVL3 (GOWN DISPOSABLE) ×1 IMPLANT
GOWN STRL REUS W/TWL LRG LVL3 (GOWN DISPOSABLE) ×2
GOWN STRL REUS W/TWL XL LVL3 (GOWN DISPOSABLE) ×2
NDL HYPO 25X1 1.5 SAFETY (NEEDLE) ×1 IMPLANT
NEEDLE HYPO 25X1 1.5 SAFETY (NEEDLE) ×2 IMPLANT
NS IRRIG 1000ML POUR BTL (IV SOLUTION) IMPLANT
PACK BASIN DAY SURGERY FS (CUSTOM PROCEDURE TRAY) ×2 IMPLANT
PENCIL SMOKE EVACUATOR (MISCELLANEOUS) ×2 IMPLANT
SLEEVE SCD COMPRESS KNEE MED (MISCELLANEOUS) ×2 IMPLANT
SPONGE LAP 4X18 RFD (DISPOSABLE) ×1 IMPLANT
SUT ETHIBOND 2 OS 4 DA (SUTURE) ×1 IMPLANT
SUT MNCRL AB 4-0 PS2 18 (SUTURE) ×2 IMPLANT
SUT NOVA 0 T19/GS 22DT (SUTURE) IMPLANT
SUT NOVA NAB DX-16 0-1 5-0 T12 (SUTURE) IMPLANT
SUT NOVA NAB GS-21 1 T12 (SUTURE) IMPLANT
SUT VIC AB 2-0 SH 27 (SUTURE)
SUT VIC AB 2-0 SH 27XBRD (SUTURE) IMPLANT
SUT VIC AB 3-0 SH 27 (SUTURE) ×2
SUT VIC AB 3-0 SH 27X BRD (SUTURE) ×1 IMPLANT
SYR CONTROL 10ML LL (SYRINGE) ×2 IMPLANT
TOWEL GREEN STERILE FF (TOWEL DISPOSABLE) ×2 IMPLANT
TUBE CONNECTING 20X1/4 (TUBING) IMPLANT
YANKAUER SUCT BULB TIP NO VENT (SUCTIONS) IMPLANT

## 2020-08-08 NOTE — Discharge Instructions (Signed)
[facility]  UMBILICAL OR INGUINAL HERNIA REPAIR: POST OP INSTRUCTIONS  Always review your discharge instruction sheet given to you by the facility where your surgery was performed. IF YOU HAVE DISABILITY OR FAMILY LEAVE FORMS, YOU MUST BRING THEM TO THE OFFICE FOR PROCESSING.   DO NOT GIVE THEM TO YOUR DOCTOR.  1. A  prescription for pain medication may be given to you upon discharge.  Take your pain medication as prescribed, if needed.  If narcotic pain medicine is not needed, then you may take acetaminophen (Tylenol) or ibuprofen (Advil) as needed. 2. Take your usually prescribed medications unless otherwise directed. If you need a refill on your pain medication, please contact your pharmacy.  They will contact our office to request authorization. Prescriptions will not be filled after 5 pm or on week-ends. 3. You should follow a light diet the first 24 hours after arrival home, such as soup and crackers, etc.  Be sure to include lots of fluids daily.  Resume your normal diet the day after surgery. 4.Most patients will experience some swelling and bruising around the umbilicus or in the groin and scrotum.  Ice packs and reclining will help.  Swelling and bruising can take several days to resolve.  6. It is common to experience some constipation if taking pain medication after surgery.  Increasing fluid intake and taking a stool softener (such as Colace) will usually help or prevent this problem from occurring.  A mild laxative (Milk of Magnesia or Miralax) should be taken according to package directions if there are no bowel movements after 48 hours. 7. Unless discharge instructions indicate otherwise, you may remove your bandages 24-48 hours after surgery, and you may shower at that time.  You may have steri-strips (small skin tapes) in place directly over the incision.  These strips should be left on the skin for 7-10 days.  If your surgeon used skin glue on the  incision, you may shower in 24 hours.  The glue will flake off over the next 2-3 weeks.  Any sutures or staples will be removed at the office during your follow-up visit. 8. ACTIVITIES:  You may resume regular (light) daily activities beginning the next day--such as daily self-care, walking, climbing stairs--gradually increasing activities as tolerated.  You may have sexual intercourse when it is comfortable.  Refrain from any heavy lifting or straining until approved by your doctor.  a.You may drive when you are no longer taking prescription pain medication, you can comfortably wear a seatbelt, and you can safely maneuver your car and apply brakes. b.RETURN TO WORK:  1 TO 2 WEEKS _____________________________________________  9.You should see your doctor in the office for a follow-up appointment approximately 2-3 weeks after your surgery.  Make sure that you call for this appointment within a day or two after you arrive home to insure a convenient appointment time. 10.OTHER INSTRUCTIONS: _OK TO SHOWER STARTING TOMORROW ICE PACK, TYLENOL, AND IBUPROFEN ALSO FOR PAIN NO LIFTING MORE THAN 15 POUNDS FOR 4 WEEKS________________________    _____________________________________  WHEN TO CALL YOUR DOCTOR: 1. Fever over 101.0 2. Inability to urinate 3. Nausea and/or vomiting 4. Extreme swelling or bruising 5. Continued bleeding from incision. 6. Increased pain, redness, or drainage from the incision  The clinic staff is available to answer your questions during regular business hours.  Please don't hesitate to call and ask to speak to one of the nurses for clinical concerns.  If you have a medical emergency, go  to the nearest emergency room or call 911.  A surgeon from [facility] is always on call at the hospital   [address], [address], [place], [place]  [address] ?  [address] [address], [place], [place]   [address] [phone] ? [phone] ? FAX [phone] Web site:  [email or web address]   Post Anesthesia Home Care Instructions  Activity: Get plenty of rest for the remainder of the day. A responsible individual must stay with you for 24 hours following the procedure.  For the next 24 hours, DO NOT: -Drive a car -Advertising copywriter -Drink alcoholic beverages -Take any medication unless instructed by your physician -Make any legal decisions or sign important papers.  Meals: Start with liquid foods such as gelatin or soup. Progress to regular foods as tolerated. Avoid greasy, spicy, heavy foods. If nausea and/or vomiting occur, drink only clear liquids until the nausea and/or vomiting subsides. Call your physician if vomiting continues.  Special Instructions/Symptoms: Your throat may feel dry or sore from the anesthesia or the breathing tube placed in your throat during surgery. If this causes discomfort, gargle with warm salt water. The discomfort should disappear within 24 hours.  If you had a scopolamine patch placed behind your ear for the management of post- operative nausea and/or vomiting:  1. The medication in the patch is effective for 72 hours, after which it should be removed.  Wrap patch in a tissue and discard in the trash. Wash hands thoroughly with soap and water. 2. You may remove the patch earlier than 72 hours if you experience unpleasant side effects which may include dry mouth, dizziness or visual disturbances. 3. Avoid touching the patch. Wash your hands with soap and water after contact with the patch.    Do not take Tylenol or Ibuprofen until 1:00 pm.

## 2020-08-08 NOTE — Interval H&P Note (Signed)
History and Physical Interval Note:no change in  H and P  08/08/2020 7:12 AM  [name]  has presented today for surgery, with the diagnosis of UMBILICAL HERNIA.  The various methods of treatment have been discussed with the patient and family. After consideration of risks, benefits and other options for treatment, the patient has consented to  Procedure(s): HERNIA REPAIR UMBILICAL WITH POSSIBLE MESH (N/A) as a surgical intervention.  The patient's history has been reviewed, patient examined, no change in status, stable for surgery.  I have reviewed the patient's chart and labs.  Questions were answered to the patient's satisfaction.     [name]

## 2020-08-08 NOTE — Anesthesia Preprocedure Evaluation (Addendum)
Anesthesia Evaluation  Patient identified by MRN, date of birth, ID band Patient awake    Reviewed: Patient's Chart, lab work & pertinent test results  Airway Mallampati: II  TM Distance: >3 FB Neck ROM: Full    Dental  (+) Teeth Intact   Pulmonary neg pulmonary ROS,    Pulmonary exam normal        Cardiovascular hypertension,  Rhythm:Regular Rate:Normal     Neuro/Psych Depression negative neurological ROS     GI/Hepatic Neg liver ROS, Umbilical hernia   Endo/Other  diabetes, Well Controlled  Renal/GU negative Renal ROS  negative genitourinary   Musculoskeletal negative musculoskeletal ROS (+)   Abdominal (+)  Abdomen: soft. Bowel sounds: normal.  Peds  Hematology negative hematology ROS (+)   Anesthesia Other Findings   Reproductive/Obstetrics                             Anesthesia Physical Anesthesia Plan  ASA: II  Anesthesia Plan: General   Post-op Pain Management:    Induction: Intravenous  PONV Risk Score and Plan: 3 and Ondansetron, Dexamethasone, Midazolam and Treatment may vary due to age or medical condition  Airway Management Planned: Mask and LMA  Additional Equipment: None  Intra-op Plan:   Post-operative Plan: Extubation in OR  Informed Consent: I have reviewed the patients History and Physical, chart, labs and discussed the procedure including the risks, benefits and alternatives for the proposed anesthesia with the patient or authorized representative who has indicated his/her understanding and acceptance.     Dental advisory given  Plan Discussed with: CRNA  Anesthesia Plan Comments: (Lab Results      Component                Value               Date                      PREGTESTUR               NEGATIVE            08/08/2020                HCG                      <5.0                04/01/2020          )       Anesthesia Quick Evaluation

## 2020-08-08 NOTE — Transfer of Care (Signed)
Immediate Anesthesia Transfer of Care Note  Patient: [name]  Procedure(s) Performed: HERNIA REPAIR UMBILICAL (N/A Abdomen)  Patient Location: PACU  Anesthesia Type:General  Level of Consciousness: awake and drowsy  Airway & Oxygen Therapy: Patient Spontanous Breathing and Patient connected to face mask oxygen  Post-op Assessment: Report given to RN and Post -op Vital signs reviewed and stable  Post vital signs: Reviewed and stable  Last Vitals:  Vitals Value Taken Time  BP 134/83 08/08/20 0756  Temp 37.1 C 08/08/20 0756  Pulse 81 08/08/20 0758  Resp 20 08/08/20 0758  SpO2 100 % 08/08/20 0758  Vitals shown include unvalidated device data.  Last Pain:  Vitals:   08/08/20 0650  TempSrc: Oral  PainSc: 0-No pain         Complications: No complications documented.

## 2020-08-08 NOTE — Anesthesia Postprocedure Evaluation (Signed)
Anesthesia Post Note  Patient: [name]  Procedure(s) Performed: HERNIA REPAIR UMBILICAL (N/A Abdomen)     Patient location during evaluation: PACU Anesthesia Type: General Level of consciousness: awake and alert Pain management: pain level controlled Vital Signs Assessment: post-procedure vital signs reviewed and stable Respiratory status: spontaneous breathing, nonlabored ventilation, respiratory function stable and patient connected to nasal cannula oxygen Cardiovascular status: blood pressure returned to baseline and stable Postop Assessment: no apparent nausea or vomiting Anesthetic complications: no   No complications documented.  Last Vitals:  Vitals:   08/08/20 0815 08/08/20 0827  BP: 125/72 125/73  Pulse: 82 82  Resp: 18 18  Temp:  36.7 C  SpO2: 99% 100%    Last Pain:  Vitals:   08/08/20 0827  TempSrc: Oral  PainSc: 0-No pain                 Earl Lites P Laird Runnion

## 2020-08-08 NOTE — Anesthesia Procedure Notes (Signed)
Procedure Name: LMA Insertion Date/Time: 08/08/2020 7:28 AM Performed by: [place], Cache Bills C, CRNA Pre-anesthesia Checklist: Patient identified, Emergency Drugs available, Suction available and Patient being monitored Patient Re-evaluated:Patient Re-evaluated prior to induction Oxygen Delivery Method: Circle system utilized Preoxygenation: Pre-oxygenation with 100% oxygen Induction Type: IV induction Ventilation: Mask ventilation without difficulty LMA: LMA inserted LMA Size: 4.0 Number of attempts: 1 Airway Equipment and Method: Bite block Placement Confirmation: positive ETCO2 Tube secured with: Tape Dental Injury: Teeth and Oropharynx as per pre-operative assessment

## 2020-08-08 NOTE — Op Note (Signed)
HERNIA REPAIR UMBILICAL  Procedure Note  [name] 08/08/2020   Pre-op Diagnosis: UMBILICAL HERNIA     Post-op Diagnosis: same  Procedure(s): HERNIA REPAIR UMBILICAL  Surgeon(s): [name], MD  Anesthesia: General  Staff:  Circulator: Maryan Rued, RN Scrub Person: Barrington Ellison, RN  Estimated Blood Loss: Minimal               Findings: The patient was found to have a very small fascial defect at the umbilicus containing chronically incarcerated fat.  The hernia was repaired primarily  Procedure: The patient was brought to the operating room and identifies correct patient.  She was placed upon the operating room table and general anesthesia was induced.  Her abdomen was prepped and draped in the usual sterile fashion.  I anesthetized the skin at the lower edge of the umbilicus with Marcaine.  I then made a transverse incision with a scalpel.  I then took this down to the fascial defect and separated the overlying umbilical skin from the hernia sac with the cautery.  I excised the small sac.  The hernia contained incarcerated fat.  The fascial defect was about 5 to 7 mm in size.  I elected to repair it primarily with a figure-of-eight #2 Ethibond suture.  Good reapproximation of the fascia peer to be achieved.  I anesthetized the surrounding fascia further with Marcaine.  I then closed the subcutaneous tissue with interrupted 3-0 Vicryl sutures and closed the skin with a running 4-0 Monocryl suture.  Dermabond was then applied.  The patient tolerated the procedure well.  All the counts were correct at the end of the procedure.  The patient was then extubated in the operating room and taken in a stable condition to the recovery room.          [name]   Date: 08/08/2020  Time: 7:53 AM

## 2020-08-09 ENCOUNTER — Encounter (HOSPITAL_BASED_OUTPATIENT_CLINIC_OR_DEPARTMENT_OTHER): Payer: Self-pay | Admitting: Surgery

## 2021-11-09 ENCOUNTER — Encounter: Payer: Self-pay | Admitting: Neurology

## 2021-11-20 ENCOUNTER — Other Ambulatory Visit: Payer: BC Managed Care – PPO

## 2021-12-13 ENCOUNTER — Other Ambulatory Visit: Payer: Self-pay

## 2021-12-13 DIAGNOSIS — R202 Paresthesia of skin: Secondary | ICD-10-CM

## 2021-12-14 ENCOUNTER — Ambulatory Visit: Payer: BC Managed Care – PPO | Admitting: Neurology

## 2021-12-14 ENCOUNTER — Other Ambulatory Visit: Payer: Self-pay

## 2021-12-14 DIAGNOSIS — R202 Paresthesia of skin: Secondary | ICD-10-CM | POA: Diagnosis not present

## 2021-12-14 NOTE — Procedures (Signed)
[facility]  ?[address] ? [place], [place] [address] ?Tel: [phone] ?Fax:  [phone] ?Test Date:  12/14/2021 ? ?Patient: [name] DOB: [birth year] Physician: Nita Sickle, DO  ?Sex: Female Height: 5\' 1"  Ref Phys: , MD  ?ID#: [id number]   Technician:   ? ?Patient Complaints: ?This is a 46 year old female referred for evaluation of bilateral hand paresthesias. ? ?NCV & EMG Findings: ?Extensive electrodiagnostic testing of the right upper extremity and additional studies of the left shows: ?Bilateral median, ulnar, and mixed palmar sensory responses are within normal limits. ?Bilateral median and ulnar motor responses are within normal limits. ?There is no evidence of active or chronic motor axonal loss changes affecting any of the tested muscles.  Motor unit configuration and recruitment pattern is within normal limits. ? ?Impression: ?This is a normal study of the upper extremities.  In particular, there is no evidence of carpal tunnel syndrome or a cervical radiculopathy. ? ? ? ?___________________________ ?52, DO ? ? ? ?Nerve Conduction Studies ?Anti Sensory Summary Table ? ? Stim Site NR Peak (ms) Norm Peak (ms) P-T Amp (?V) Norm P-T Amp  ?Left Median Anti Sensory (2nd Digit)  35?C  ?Wrist    2.4 <3.4 78.1 >20  ?Right Median Anti Sensory (2nd Digit)  35?C  ?Wrist    2.5 <3.4 70.3 >20  ?Left Ulnar Anti Sensory (5th Digit)  35?C  ?Wrist    2.1 <3.1 76.0 >12  ?Right Ulnar Anti Sensory (5th Digit)  35?C  ?Wrist    2.1 <3.1 63.5 >12  ? ?Motor Summary Table ? ? Stim Site NR Onset (ms) Norm Onset (ms) O-P Amp (mV) Norm O-P Amp Site1 Site2 Delta-0 (ms) Dist (cm) Vel (m/s) Norm Vel (m/s)  ?Left Median Motor (Abd Poll Brev)  35?C  ?Wrist    2.6 <3.9 12.3 >6 Elbow Wrist 4.4 26.0 59 >50  ?Elbow    7.0  11.1         ?Right Median Motor (Abd Poll Brev)  35?C  ?Wrist    2.1 <3.9 11.2 >6 Elbow Wrist 4.1 26.0 63 >50  ?Elbow    6.2  10.6         ?Left Ulnar Motor (Abd Dig  Minimi)  35?C  ?Wrist    2.0 <3.1 12.1 >7 B Elbow Wrist 3.2 20.0 63 >50  ?B Elbow    5.2  11.1  A Elbow B Elbow 1.8 10.0 56 >50  ?A Elbow    7.0  10.4         ?Right Ulnar Motor (Abd Dig Minimi)  35?C  ?Wrist    1.6 <3.1 10.1 >7 B Elbow Wrist 3.2 20.0 63 >50  ?B Elbow    4.8  9.5  A Elbow B Elbow 1.7 10.0 59 >50  ?A Elbow    6.5  8.9         ? ?Comparison Summary Table ? ? Stim Site NR Peak (ms) Norm Peak (ms) P-T Amp (?V) Site1 Site2 Delta-P (ms) Norm Delta (ms)  ?Left Median/Ulnar Palm Comparison (Wrist - 8cm)  35?C  ?Median Palm    1.4 <2.2 124.9 Median Palm Ulnar Palm 0.1   ?Ulnar Palm    1.3 <2.2 46.7      ?Right Median/Ulnar Palm Comparison (Wrist - 8cm)  35?C  ?Median Palm    1.3 <2.2 125.8 Median Palm Ulnar Palm 0.1   ?Ulnar Palm    1.2 <2.2 32.6      ? ?  EMG ? ? Side Muscle Ins Act Fibs Psw Fasc Number Recrt Dur Dur. Amp Amp. Poly Poly. Comment  ?Right 1stDorInt Nml Nml Nml Nml Nml Nml Nml Nml Nml Nml Nml Nml N/A  ?Left 1stDorInt Nml Nml Nml Nml Nml Nml Nml Nml Nml Nml Nml Nml N/A  ?Left PronatorTeres Nml Nml Nml Nml Nml Nml Nml Nml Nml Nml Nml Nml N/A  ?Left Biceps Nml Nml Nml Nml Nml Nml Nml Nml Nml Nml Nml Nml N/A  ?Left Triceps Nml Nml Nml Nml Nml Nml Nml Nml Nml Nml Nml Nml N/A  ?Left Deltoid Nml Nml Nml Nml Nml Nml Nml Nml Nml Nml Nml Nml N/A  ?Right PronatorTeres Nml Nml Nml Nml Nml Nml Nml Nml Nml Nml Nml Nml N/A  ?Right Biceps Nml Nml Nml Nml Nml Nml Nml Nml Nml Nml Nml Nml N/A  ?Right Triceps Nml Nml Nml Nml Nml Nml Nml Nml Nml Nml Nml Nml N/A  ?Right Deltoid Nml Nml Nml Nml Nml Nml Nml Nml Nml Nml Nml Nml N/A  ? ? ? ? ?Waveforms: ?    ? ?    ? ?    ? ?  ? ? ?

## 2022-01-30 IMAGING — NM NM HEPATO W/GB/PHARM/[PERSON_NAME]
2 series · 12 of 12 positions shown · non-contrast
Comparison: None

CLINICAL DATA: RIGHT-side abdominal pain progressively worsening
over the past couple months, intermittent nonradiating pain, colicky

EXAM:
NUCLEAR MEDICINE HEPATOBILIARY IMAGING WITH GALLBLADDER EF
TECHNIQUE: Sequential images of the abdomen were obtained [DATE] minutes
following intravenous administration of radiopharmaceutical. After
oral ingestion of Ensure, gallbladder ejection fraction was
determined. At 60 min, normal ejection fraction is greater than 33%.
RADIOPHARMACEUTICALS:  5.1 mCi Jc-99m  Choletec IV

[he hepatobiliary · 4.52mm/px · 6 of 60 frames shown (1 of 2)]
[frame 6/60]
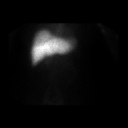
[frame 16/60]
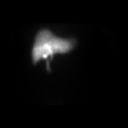
[frame 26/60]
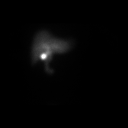
[frame 36/60]
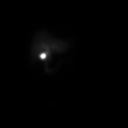
[frame 46/60]
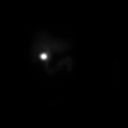
[frame 56/60]
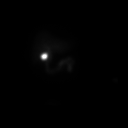

[he hepatobiliary · 4.52mm/px · 6 of 60 frames shown (2 of 2)]
[frame 6/60]
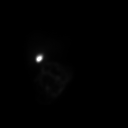
[frame 16/60]
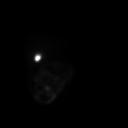
[frame 26/60]
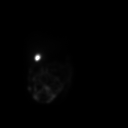
[frame 36/60]
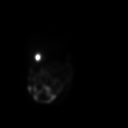
[frame 46/60]
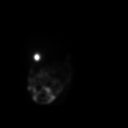
[frame 56/60]
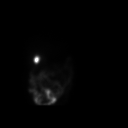

[12 of 12 positions shown; findings below may reference images not displayed]

FINDINGS: Normal tracer extraction from bloodstream indicating normal
hepatocellular function.

Normal excretion of tracer into biliary tree.

Gallbladder visualized at 16 min.

Small bowel visualized at 13 min.

No hepatic retention of tracer.

Subjectively normal emptying of tracer from gallbladder following
fatty meal stimulation.

Calculated gallbladder ejection fraction is 61%, normal.

Patient reported no symptoms following Ensure ingestion.

Normal gallbladder ejection fraction following Ensure ingestion is
greater than 33% at 1 hour.
IMPRESSION: Patent biliary tree with normal gallbladder ejection fraction of 61%
following fatty meal stimulation.

## 2024-02-28 ENCOUNTER — Other Ambulatory Visit: Payer: Self-pay | Admitting: Family Medicine

## 2024-02-28 ENCOUNTER — Ambulatory Visit
Admission: RE | Admit: 2024-02-28 | Discharge: 2024-02-28 | Disposition: A | Discharge status: Home / Self Care | Source: Ambulatory Visit | Attending: Family Medicine | Admitting: Family Medicine

## 2024-02-28 DIAGNOSIS — M25511 Pain in right shoulder: Secondary | ICD-10-CM
